# Patient Record
Sex: Male | Born: 1955 | Race: Black or African American | Hispanic: No | Marital: Married | Smoking: Former smoker
Health system: Southern US, Community
[De-identification: ages and names within clinical notes are randomized; demographics above are authoritative.]

## PROBLEM LIST (undated history)

## (undated) DIAGNOSIS — T7840XA Allergy, unspecified, initial encounter: Secondary | ICD-10-CM

## (undated) DIAGNOSIS — M1990 Unspecified osteoarthritis, unspecified site: Secondary | ICD-10-CM

## (undated) DIAGNOSIS — I10 Essential (primary) hypertension: Secondary | ICD-10-CM

## (undated) DIAGNOSIS — K635 Polyp of colon: Secondary | ICD-10-CM

## (undated) DIAGNOSIS — E559 Vitamin D deficiency, unspecified: Secondary | ICD-10-CM

## (undated) DIAGNOSIS — N529 Male erectile dysfunction, unspecified: Secondary | ICD-10-CM

## (undated) DIAGNOSIS — E785 Hyperlipidemia, unspecified: Secondary | ICD-10-CM

## (undated) HISTORY — PX: HERNIA REPAIR: SHX51

## (undated) HISTORY — DX: Hyperlipidemia, unspecified: E78.5

## (undated) HISTORY — DX: Polyp of colon: K63.5

## (undated) HISTORY — DX: Essential (primary) hypertension: I10

## (undated) HISTORY — DX: Unspecified osteoarthritis, unspecified site: M19.90

## (undated) HISTORY — DX: Vitamin D deficiency, unspecified: E55.9

## (undated) HISTORY — PX: CYSTECTOMY: SUR359

## (undated) HISTORY — DX: Allergy, unspecified, initial encounter: T78.40XA

## (undated) HISTORY — DX: Male erectile dysfunction, unspecified: N52.9

---

## 2006-11-17 DIAGNOSIS — K635 Polyp of colon: Secondary | ICD-10-CM

## 2006-11-17 HISTORY — DX: Polyp of colon: K63.5

## 2011-11-28 ENCOUNTER — Ambulatory Visit (HOSPITAL_COMMUNITY)
Admission: RE | Admit: 2011-11-28 | Discharge: 2011-11-28 | Disposition: A | Discharge status: Home / Self Care | Payer: BC Managed Care – PPO | Source: Ambulatory Visit | Attending: Family Medicine | Admitting: Family Medicine

## 2011-11-28 ENCOUNTER — Other Ambulatory Visit: Payer: Self-pay | Admitting: Family Medicine

## 2011-11-28 DIAGNOSIS — M79651 Pain in right thigh: Secondary | ICD-10-CM

## 2011-11-28 DIAGNOSIS — M79609 Pain in unspecified limb: Secondary | ICD-10-CM | POA: Insufficient documentation

## 2013-07-30 ENCOUNTER — Other Ambulatory Visit: Payer: Self-pay | Admitting: Family Medicine

## 2013-08-01 NOTE — Telephone Encounter (Signed)
Patient needs to be seen. Has exceeded time since last visit. Needs to bring all medications to next appointment. Due for OV. Rx refilled x 1

## 2013-08-01 NOTE — Telephone Encounter (Signed)
Last seen 01/28/13  [facility]

## 2013-08-01 NOTE — Telephone Encounter (Signed)
[facility] notified of refill  

## 2013-08-03 ENCOUNTER — Other Ambulatory Visit: Payer: Self-pay | Admitting: Nurse Practitioner

## 2013-08-04 NOTE — Telephone Encounter (Signed)
Patient needs to be seen. Has exceeded time since last visit. Needs to bring all medications to next appointment.   

## 2013-08-04 NOTE — Telephone Encounter (Signed)
Not seen since [facility]  You just filled Norvasc x 1 and said needs to be seen

## 2013-08-19 ENCOUNTER — Ambulatory Visit (INDEPENDENT_AMBULATORY_CARE_PROVIDER_SITE_OTHER): Payer: BC Managed Care – PPO | Admitting: Family Medicine

## 2013-08-19 ENCOUNTER — Encounter: Payer: Self-pay | Admitting: Family Medicine

## 2013-08-19 ENCOUNTER — Ambulatory Visit (INDEPENDENT_AMBULATORY_CARE_PROVIDER_SITE_OTHER): Payer: BC Managed Care – PPO

## 2013-08-19 VITALS — BP 144/81 | HR 64 | Temp 97.0°F | Ht 71.5 in | Wt 250.6 lb

## 2013-08-19 DIAGNOSIS — M1990 Unspecified osteoarthritis, unspecified site: Secondary | ICD-10-CM | POA: Insufficient documentation

## 2013-08-19 DIAGNOSIS — E559 Vitamin D deficiency, unspecified: Secondary | ICD-10-CM | POA: Insufficient documentation

## 2013-08-19 DIAGNOSIS — E669 Obesity, unspecified: Secondary | ICD-10-CM

## 2013-08-19 DIAGNOSIS — M25512 Pain in left shoulder: Secondary | ICD-10-CM

## 2013-08-19 DIAGNOSIS — E785 Hyperlipidemia, unspecified: Secondary | ICD-10-CM

## 2013-08-19 DIAGNOSIS — T7840XA Allergy, unspecified, initial encounter: Secondary | ICD-10-CM | POA: Insufficient documentation

## 2013-08-19 DIAGNOSIS — K635 Polyp of colon: Secondary | ICD-10-CM | POA: Insufficient documentation

## 2013-08-19 DIAGNOSIS — J302 Other seasonal allergic rhinitis: Secondary | ICD-10-CM

## 2013-08-19 DIAGNOSIS — I10 Essential (primary) hypertension: Secondary | ICD-10-CM | POA: Insufficient documentation

## 2013-08-19 DIAGNOSIS — J309 Allergic rhinitis, unspecified: Secondary | ICD-10-CM

## 2013-08-19 DIAGNOSIS — M25519 Pain in unspecified shoulder: Secondary | ICD-10-CM

## 2013-08-19 DIAGNOSIS — N529 Male erectile dysfunction, unspecified: Secondary | ICD-10-CM | POA: Insufficient documentation

## 2013-08-19 MED ORDER — NEBIVOLOL HCL 20 MG PO TABS: 20.0000 mg | ORAL_TABLET | Freq: Every morning | ORAL | Status: DC

## 2013-08-19 MED ORDER — INDOMETHACIN 25 MG PO CAPS: 25.0000 mg | ORAL_CAPSULE | Freq: Two times a day (BID) | ORAL | Status: DC

## 2013-08-19 MED ORDER — METHOCARBAMOL 750 MG PO TABS: 750.0000 mg | ORAL_TABLET | ORAL | Status: DC | PRN

## 2013-08-19 MED ORDER — AMLODIPINE BESYLATE 10 MG PO TABS: ORAL_TABLET | ORAL | Status: DC

## 2013-08-19 MED ORDER — LEVOCETIRIZINE DIHYDROCHLORIDE 5 MG PO TABS: ORAL_TABLET | ORAL | Status: DC

## 2013-08-19 NOTE — Patient Instructions (Addendum)
      Dr Manan Olmo's Recommendations  For nutrition information, I recommend books:  1).Eat to Live by Dr Joel Fuhrman. 2).Prevent and Reverse Heart Disease by Dr Caldwell Esselstyn. 3) Dr Neal Barnard's Book:  Program to Reverse Diabetes 4) The [place] Study by T Colin Campbell  Exercise recommendations are:  If unable to walk, then the patient can exercise in a chair 3 times a day. By flapping arms like a bird gently and raising legs outwards to the front.  If ambulatory, the patient can go for walks for 30 minutes 3 times a week. Then increase the intensity and duration as tolerated.  Goal is to try to attain exercise frequency to 5 times a week.  If applicable: Best to perform resistance exercises (machines or weights) 2 days a week and cardio type exercises 3 days per week.  

## 2013-08-19 NOTE — Progress Notes (Signed)
Patient ID: Gregory Evans, male   DOB: 27-Mar-1956, 57 y.o.   MRN: [id number] SUBJECTIVE: CC: Chief Complaint  Patient presents with  . Follow-up    follow up bp  left arm painful shoulder rotator cuff area  states i"its doing same thing as the right shoulder . thinks over worked   . 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 Saw orthopedics and received a cortisone shot.and  Exercise. Now the left shoulder is giving the same pro blem. Was using the left shoulder more.  Patient is here for follow up of hypertension: denies Headache;deniesChest Pain;denies weakness;denies Shortness of Breath or Orthopnea;denies Visual changes;denies palpitations;denies cough;denies pedal edema;denies symptoms of TIA or stroke; admits to Compliance with medications. denies Problems with medications.  Past Medical History  Diagnosis Date  . Hypertension   . Colon polyp 2008  . Allergy   . Vitamin D deficiency   . Osteoarthritis   . Erectile dysfunction   . Hyperlipidemia    No past surgical history on file. History   Social History  . Marital Status: Married    Spouse Name: N/A    Number of Children: N/A  . Years of Education: N/A   Occupational History  .  [employer] [place]   Social History Main Topics  . Smoking status: Former Smoker    Quit date: 08/19/1993  . Smokeless tobacco: Not on file  . Alcohol Use: Not on file  . Drug Use: Not on file  . Sexual Activity: Not on file   Other Topics Concern  . Not on file   Social History Narrative   Married    6 children   Outside pet   No family history on file. Current Outpatient Prescriptions on File Prior to Visit  Medication Sig Dispense Refill  . amLODipine (NORVASC) 10 MG tablet TAKE 1 TABLET BY MOUTH EVERY DAY  30 tablet  0  . levocetirizine (XYZAL) 5 MG tablet TAKE 1 TABLET BY MOUTH EVERY DAY  30 tablet  0   No current facility-administered medications on file prior to visit.    Allergies  Allergen Reactions  . Ace Inhibitors     angioedema  . Cozaar [Losartan Potassium]    Immunization History  Administered Date(s) Administered  . Tdap 09/17/2006   Prior to Admission medications   Medication Sig Start Date End Date Taking? Authorizing Provider  amLODipine (NORVASC) 10 MG tablet TAKE 1 TABLET BY MOUTH EVERY DAY 07/30/13  Yes Ileana Ladd, MD  BYSTOLIC 20 MG TABS  08/03/13  Yes Historical Provider, MD  levocetirizine (XYZAL) 5 MG tablet TAKE 1 TABLET BY MOUTH EVERY DAY 08/03/13  Yes Ileana Ladd, MD  methocarbamol (ROBAXIN) 750 MG tablet Take 750 mg by mouth as needed.  06/27/13  Yes Historical Provider, MD    ROS: As above in the HPI. All other systems are stable or negative.  OBJECTIVE: APPEARANCE:  Patient in no acute distress.The patient appeared well nourished and normally developed. Acyanotic. Waist: VITAL SIGNS:BP 144/81  Pulse 64  Temp(Src) 97 F (36.1 C) (Oral)  Ht 5' 11.5" (1.816 m)  Wt 250 lb 9.6 oz (113.671 kg)  BMI 34.47 kg/m2 130/80 [place] Male  SKIN: warm and  Dry without overt rashes, tattoos and scars  HEAD and Neck: without JVD, Head and scalp: normal Eyes:No scleral icterus. Fundi normal, eye movements normal. Ears: Auricle normal, canal normal, Tympanic membranes normal, insufflation normal. Nose: normal  Throat: normal Neck & thyroid: normal  CHEST & LUNGS: Chest wall: normal Lungs: Clear  CVS: Reveals the PMI to be normally located. Regular rhythm, First and Second Heart sounds are normal,  absence of murmurs, rubs or gallops. Peripheral vasculature: Radial pulses: normal Dorsal pedis pulses: normal Posterior pulses: normal  ABDOMEN:  Appearance: obese Benign, no organomegaly, no masses, no Abdominal Aortic enlargement. No Guarding , no rebound. No Bruits. Bowel sounds: normal  RECTAL: N/A GU: N/A  EXTREMETIES: nonedematous.  MUSCULOSKELETAL:  Spine: normal Joints: left shoulder  FROM  NEUROLOGIC: oriented to time,place and person; nonfocal.  ASSESSMENT:  PLAN: Orders Placed This Encounter  Procedures  . DG Shoulder Left    Standing Status: Future     Number of Occurrences: 1     Standing Expiration Date: 10/19/2014    Order Specific Question:  Reason for Exam (SYMPTOM  OR DIAGNOSIS REQUIRED)    Answer:  left shoulder pain    Order Specific Question:  Preferred imaging location?    Answer:  Internal  . CMP14+EGFR  . NMR, lipoprofile  . Vit D  25 hydroxy (rtn osteoporosis monitoring)    [facility] reading (PRIMARY) by  Dr. Modesto Charon: No acute findings.                         Meds ordered this encounter  Medications  . DISCONTD: methocarbamol (ROBAXIN) 750 MG tablet    Sig: Take 750 mg by mouth as needed.   Marland Kitchen DISCONTD: BYSTOLIC 20 MG TABS    Sig:   . Nebivolol HCl (BYSTOLIC) 20 MG TABS    Sig: Take 1 tablet (20 mg total) by mouth every morning.    Dispense:  30 tablet    Refill:  5  . amLODipine (NORVASC) 10 MG tablet    Sig: TAKE 1 TABLET BY MOUTH EVERY DAY    Dispense:  30 tablet    Refill:  5    Now due for an Office visit.  . methocarbamol (ROBAXIN) 750 MG tablet    Sig: Take 1 tablet (750 mg total) by mouth as needed.    Dispense:  30 tablet    Refill:  1  . levocetirizine (XYZAL) 5 MG tablet    Sig: TAKE 1 TABLET BY MOUTH EVERY DAY    Dispense:  30 tablet    Refill:  3    Needs to be seen. Last refill.  . indomethacin (INDOCIN) 25 MG capsule    Sig: Take 1 capsule (25 mg total) by mouth 2 (two) times daily with a meal.    Dispense:  30 capsule    Refill:  0    Rx sildenafil 20 mg # 90 RF x 3 Take 2 to 5 prn sexual activity.  Procedure: Dx: left shoulder tendinopathy Medication: lidocaine+ Marcaine+1/2 cc K40 as per protocol. Area of the anterior left shoulder was prepped with Betadine and alcohol. Anterior approach injection under asepsis into the left shoulder joint. No anesthesia was necessary. Medication administered intra-articularly  without any problems or  Resistance. bandaid applied. Patient started to feel relief within 5 minutes of administration.  Return in about 4 months (around 12/20/2013) for Recheck medical problems.  Criag Wicklund P. Modesto Charon, M.D.

## 2013-08-20 LAB — CMP14+EGFR
ALT: 22 IU/L (ref 0–44)
AST: 18 IU/L (ref 0–40)
Albumin/Globulin Ratio: 1.8 (ref 1.1–2.5)
Albumin: 4.6 g/dL (ref 3.5–5.5)
Alkaline Phosphatase: 40 IU/L (ref 39–117)
BUN/Creatinine Ratio: 16 (ref 9–20)
BUN: 14 mg/dL (ref 6–24)
CO2: 26 mmol/L (ref 18–29)
Calcium: 9.2 mg/dL (ref 8.7–10.2)
Chloride: 100 mmol/L (ref 97–108)
Creatinine, Ser: 0.87 mg/dL (ref 0.76–1.27)
GFR calc Af Amer: 109 mL/min/{1.73_m2} (ref >59)
GFR calc non Af Amer: 94 mL/min/{1.73_m2} (ref >59)
Globulin, Total: 2.5 g/dL (ref 1.5–4.5)
Glucose: 99 mg/dL (ref 65–99)
Potassium: 4.5 mmol/L (ref 3.5–5.2)
Sodium: 141 mmol/L (ref 134–144)
Total Bilirubin: 0.5 mg/dL (ref 0.0–1.2)
Total Protein: 7.1 g/dL (ref 6.0–8.5)

## 2013-08-20 LAB — NMR, LIPOPROFILE
Cholesterol: 171 mg/dL (ref <200)
HDL Cholesterol by NMR: 49 mg/dL (ref >=40)
HDL Particle Number: 39.5 umol/L (ref >=30.5)
LDL Particle Number: 1669 nmol/L — ABNORMAL HIGH (ref <1000)
LDL Size: 20.7 nm (ref >20.5)
LDLC SERPL CALC-MCNC: 106 mg/dL — ABNORMAL HIGH (ref <100)
LP-IR Score: 75 — ABNORMAL HIGH (ref <=45)
Small LDL Particle Number: 816 nmol/L — ABNORMAL HIGH (ref <=527)
Triglycerides by NMR: 82 mg/dL (ref <150)

## 2013-08-20 LAB — VITAMIN D 25 HYDROXY (VIT D DEFICIENCY, FRACTURES): Vit D, 25-Hydroxy: 24.2 ng/mL — ABNORMAL LOW (ref 30.0–100.0)

## 2013-08-21 ENCOUNTER — Other Ambulatory Visit: Payer: Self-pay | Admitting: Family Medicine

## 2013-08-21 DIAGNOSIS — E559 Vitamin D deficiency, unspecified: Secondary | ICD-10-CM

## 2013-08-21 DIAGNOSIS — E785 Hyperlipidemia, unspecified: Secondary | ICD-10-CM

## 2013-08-21 MED ORDER — PRAVASTATIN SODIUM 20 MG PO TABS: 20.0000 mg | ORAL_TABLET | Freq: Every day | ORAL | Status: DC

## 2013-09-05 ENCOUNTER — Other Ambulatory Visit: Payer: Self-pay | Admitting: Family Medicine

## 2013-10-11 ENCOUNTER — Other Ambulatory Visit: Payer: Self-pay | Admitting: Family Medicine

## 2013-10-18 ENCOUNTER — Other Ambulatory Visit: Payer: Self-pay | Admitting: Family Medicine

## 2013-12-03 ENCOUNTER — Other Ambulatory Visit: Payer: Self-pay | Admitting: Family Medicine

## 2013-12-05 NOTE — Telephone Encounter (Signed)
Call patient : Prescription refilled & sent to pharmacy in [place]. 

## 2013-12-14 ENCOUNTER — Other Ambulatory Visit: Payer: Self-pay | Admitting: Family Medicine

## 2013-12-22 ENCOUNTER — Ambulatory Visit: Payer: BC Managed Care – PPO | Admitting: Family Medicine

## 2014-02-11 ENCOUNTER — Other Ambulatory Visit: Payer: Self-pay | Admitting: Family Medicine

## 2014-03-09 ENCOUNTER — Other Ambulatory Visit: Payer: Self-pay | Admitting: Family Medicine

## 2014-03-09 NOTE — Telephone Encounter (Signed)
Last seen 08/19/13  [facility]

## 2014-03-10 NOTE — Telephone Encounter (Signed)
Patient needs to be seen. Has exceeded time since last visit. Limited quantity refilled. Needs to bring all medications to next appointment.   

## 2014-03-17 ENCOUNTER — Other Ambulatory Visit: Payer: Self-pay | Admitting: Family Medicine

## 2014-04-21 ENCOUNTER — Telehealth: Payer: Self-pay | Admitting: Family Medicine

## 2014-04-21 MED ORDER — NEBIVOLOL HCL 10 MG PO TABS: 20.0000 mg | ORAL_TABLET | Freq: Every day | ORAL | Status: DC

## 2014-04-21 MED ORDER — AMLODIPINE BESYLATE 10 MG PO TABS
10.0000 mg | ORAL_TABLET | Freq: Every day | ORAL | Status: DC
Start: 2014-04-21 — End: 2014-05-10

## 2014-04-21 NOTE — Telephone Encounter (Signed)
Appt scheduled for 05/10/14. Patient given one refill of amlodipine and bystolic.  Aware that he will have to be seen before he can get any more refills.

## 2014-05-10 ENCOUNTER — Encounter: Payer: Self-pay | Admitting: Family

## 2014-05-10 ENCOUNTER — Ambulatory Visit (INDEPENDENT_AMBULATORY_CARE_PROVIDER_SITE_OTHER): Payer: BC Managed Care – PPO | Admitting: Family

## 2014-05-10 ENCOUNTER — Encounter (INDEPENDENT_AMBULATORY_CARE_PROVIDER_SITE_OTHER): Payer: Self-pay

## 2014-05-10 VITALS — BP 138/82 | HR 47 | Temp 98.9°F | Ht 71.5 in | Wt 264.0 lb

## 2014-05-10 DIAGNOSIS — Z23 Encounter for immunization: Secondary | ICD-10-CM

## 2014-05-10 DIAGNOSIS — E785 Hyperlipidemia, unspecified: Secondary | ICD-10-CM

## 2014-05-10 DIAGNOSIS — E559 Vitamin D deficiency, unspecified: Secondary | ICD-10-CM

## 2014-05-10 DIAGNOSIS — M109 Gout, unspecified: Secondary | ICD-10-CM

## 2014-05-10 DIAGNOSIS — Z0000 Encounter for general adult medical examination without abnormal findings: Secondary | ICD-10-CM

## 2014-05-10 DIAGNOSIS — N529 Male erectile dysfunction, unspecified: Secondary | ICD-10-CM

## 2014-05-10 DIAGNOSIS — I10 Essential (primary) hypertension: Secondary | ICD-10-CM

## 2014-05-10 MED ORDER — SILDENAFIL CITRATE 20 MG PO TABS: 20.0000 mg | ORAL_TABLET | Freq: Three times a day (TID) | ORAL | Status: DC

## 2014-05-10 MED ORDER — PRAVASTATIN SODIUM 20 MG PO TABS: ORAL_TABLET | ORAL | Status: DC

## 2014-05-10 MED ORDER — METOPROLOL TARTRATE 100 MG PO TABS: 100.0000 mg | ORAL_TABLET | Freq: Two times a day (BID) | ORAL | Status: DC

## 2014-05-10 MED ORDER — AMLODIPINE BESYLATE 10 MG PO TABS: 10.0000 mg | ORAL_TABLET | Freq: Every day | ORAL | Status: DC

## 2014-05-10 MED ORDER — INDOMETHACIN 25 MG PO CAPS: ORAL_CAPSULE | ORAL | Status: DC

## 2014-05-10 NOTE — Patient Instructions (Addendum)
Herpes Zoster Virus Vaccine What is this medicine? HERPES ZOSTER VIRUS VACCINE (HUR peez ZOS ter vahy ruhs vak SEEN) is a vaccine. It is used to prevent shingles in adults 60 years old and over. This vaccine is not used to treat shingles or nerve pain from shingles. This medicine may be used for other purposes; ask your health care provider or pharmacist if you have questions. COMMON BRAND NAME(S): Varivax, Zostavax What should I tell my health care provider before I take this medicine? They need to know if you have any of these conditions: -cancer like leukemia or lymphoma -immune system problems or therapy -infection with fever -tuberculosis -an unusual or allergic reaction to vaccines, neomycin, gelatin, other medicines, foods, dyes, or preservatives -pregnant or trying to get pregnant -breast-feeding How should I use this medicine? This vaccine is for injection under the skin. It is given by a health care professional. Talk to your pediatrician regarding the use of this medicine in children. This medicine is not approved for use in children. Overdosage: If you think you have taken too much of this medicine contact a poison control center or emergency room at once. NOTE: This medicine is only for you. Do not share this medicine with others. What if I miss a dose? This does not apply. What may interact with this medicine? Do not take this medicine with any of the following medications: -adalimumab -anakinra -etanercept -infliximab -medicines to treat cancer -medicines that suppress your immune system This medicine may also interact with the following medications: -immunoglobulins -steroid medicines like prednisone or cortisone This list may not describe all possible interactions. Give your health care provider a list of all the medicines, herbs, non-prescription drugs, or dietary supplements you use. Also tell them if you smoke, drink alcohol, or use illegal drugs. Some items may  interact with your medicine. What should I watch for while using this medicine? Visit your doctor for regular check ups. This vaccine, like all vaccines, may not fully protect everyone. After receiving this vaccine it may be possible to pass chickenpox infection to others. Avoid people with immune system problems, pregnant women who have not had chickenpox, and newborns of women who have not had chickenpox. Talk to your doctor for more information. What side effects may I notice from receiving this medicine? Side effects that you should report to your doctor or health care professional as soon as possible: -allergic reactions like skin rash, itching or hives, swelling of the face, lips, or tongue -breathing problems -feeling faint or lightheaded, falls -fever, flu-like symptoms -pain, tingling, numbness in the hands or feet -swelling of the ankles, feet, hands -unusually weak or tired Side effects that usually do not require medical attention (report to your doctor or health care professional if they continue or are bothersome): -aches or pains -chickenpox-like rash -diarrhea -headache -loss of appetite -nausea, vomiting -redness, pain, swelling at site where injected -runny nose This list may not describe all possible side effects. Call your doctor for medical advice about side effects. You may report side effects to FDA at 1-800-FDA-1088. Where should I keep my medicine? This drug is given in a hospital or clinic and will not be stored at home. NOTE: This sheet is a summary. It may not cover all possible information. If you have questions about this medicine, talk to your doctor, pharmacist, or health care provider.  2015, Elsevier/Gold Standard. (2010-04-22 17:43:50)  Hypertension Hypertension, commonly called high blood pressure, is when the force of blood pumping through your  arteries is too strong. Your arteries are the blood vessels that carry blood from your heart throughout  your body. A blood pressure reading consists of a higher number over a lower number, such as 110/72. The higher number (systolic) is the pressure inside your arteries when your heart pumps. The lower number (diastolic) is the pressure inside your arteries when your heart relaxes. Ideally you want your blood pressure below 120/80. Hypertension forces your heart to work harder to pump blood. Your arteries may become narrow or stiff. Having hypertension puts you at risk for heart disease, stroke, and other problems.  RISK FACTORS Some risk factors for high blood pressure are controllable. Others are not.  Risk factors you cannot control include:   Race. You may be at higher risk if you are African American.  Age. Risk increases with age.  Gender. Men are at higher risk than women before age 45 years. After age 10, women are at higher risk than men. Risk factors you can control include:  Not getting enough exercise or physical activity.  Being overweight.  Getting too much fat, sugar, calories, or salt in your diet.  Drinking too much alcohol. SIGNS AND SYMPTOMS Hypertension does not usually cause signs or symptoms. Extremely high blood pressure (hypertensive crisis) may cause headache, anxiety, shortness of breath, and nosebleed. DIAGNOSIS  To check if you have hypertension, your health care provider will measure your blood pressure while you are seated, with your arm held at the level of your heart. It should be measured at least twice using the same arm. Certain conditions can cause a difference in blood pressure between your right and left arms. A blood pressure reading that is higher than normal on one occasion does not mean that you need treatment. If one blood pressure reading is high, ask your health care provider about having it checked again. TREATMENT  Treating high blood pressure includes making lifestyle changes and possibly taking medication. Living a healthy lifestyle can help  lower high blood pressure. You may need to change some of your habits. Lifestyle changes may include:  Following the DASH diet. This diet is high in fruits, vegetables, and whole grains. It is low in salt, red meat, and added sugars.  Getting at least 2 1/2 hours of brisk physical activity every week.  Losing weight if necessary.  Not smoking.  Limiting alcoholic beverages.  Learning ways to reduce stress. If lifestyle changes are not enough to get your blood pressure under control, your health care provider may prescribe medicine. You may need to take more than one. Work closely with your health care provider to understand the risks and benefits. HOME CARE INSTRUCTIONS  Have your blood pressure rechecked as directed by your health care provider.   Only take medicine as directed by your health care provider. Follow the directions carefully. Blood pressure medicines must be taken as prescribed. The medicine does not work as well when you skip doses. Skipping doses also puts you at risk for problems.   Do not smoke.   Monitor your blood pressure at home as directed by your health care provider. SEEK MEDICAL CARE IF:   You think you are having a reaction to medicines taken.  You have recurrent headaches or feel dizzy.  You have swelling in your ankles.  You have trouble with your vision. SEEK IMMEDIATE MEDICAL CARE IF:  You develop a severe headache or confusion.  You have unusual weakness, numbness, or feel faint.  You have severe chest  or abdominal pain.  You vomit repeatedly.  You have trouble breathing. MAKE SURE YOU:   Understand these instructions.  Will watch your condition.  Will get help right away if you are not doing well or get worse. Document Released: 11/03/2005 Document Revised: 11/08/2013 Document Reviewed: 08/26/2013 [facility] Patient Information 2015 [place], [place]. This information is not intended to replace advice given to you by your health  care provider. Make sure you discuss any questions you have with your health care provider.

## 2014-05-10 NOTE — Progress Notes (Signed)
Subjective:    Patient ID: Gregory Evans, male    DOB: 09-18-1956, 58 y.o.   MRN: [id number]  Hypertension This is a chronic problem. The current episode started more than 1 year ago. The problem has been waxing and waning since onset. The problem is uncontrolled. Associated symptoms include peripheral edema. Pertinent negatives include no anxiety, headaches, palpitations or shortness of breath. Risk factors for coronary artery disease include dyslipidemia, male gender and obesity. Past treatments include beta blockers and calcium channel blockers. The current treatment provides moderate improvement. There is no history of kidney disease, CAD/MI or a thyroid problem. There is no history of sleep apnea.  Hyperlipidemia This is a chronic problem. The current episode started more than 1 year ago. The problem is controlled. Recent lipid tests were reviewed and are normal. He has no history of diabetes or hypothyroidism. Associated symptoms include leg pain. Pertinent negatives include no shortness of breath. Current antihyperlipidemic treatment includes statins. The current treatment provides moderate improvement of lipids. Risk factors for coronary artery disease include dyslipidemia, hypertension and male sex.      Review of Systems  HENT: Negative.   Respiratory: Negative.  Negative for shortness of breath.   Cardiovascular: Negative for palpitations.  Gastrointestinal: Negative.   Genitourinary: Negative.   Musculoskeletal: Negative.   Neurological: Negative for headaches.  All other systems reviewed and are negative.      Objective:   Physical Exam  Vitals reviewed. Constitutional: He is oriented to person, place, and time. He appears well-developed and well-nourished. No distress.  HENT:  Head: Normocephalic.  Right Ear: External ear normal.  Left Ear: External ear normal.  Mouth/Throat: Oropharynx is clear and moist.  Eyes: Pupils are equal, round, and reactive to light. Right eye  exhibits no discharge. Left eye exhibits no discharge.  Neck: Normal range of motion. Neck supple. No thyromegaly present.  Cardiovascular: Normal rate, regular rhythm, normal heart sounds and intact distal pulses.   No murmur heard. Pulmonary/Chest: Effort normal and breath sounds normal. No respiratory distress. He has no wheezes.  Abdominal: Soft. Bowel sounds are normal. He exhibits no distension. There is no tenderness.  Musculoskeletal: Normal range of motion. He exhibits no edema and no tenderness.  Neurological: He is alert and oriented to person, place, and time. He has normal reflexes. No cranial nerve deficit.  Skin: Skin is warm and dry. No rash noted. No erythema.  Psychiatric: He has a normal mood and affect. His behavior is normal. Judgment and thought content normal.   BP 148/81  Pulse 47  Temp(Src) 98.9 F (37.2 C) (Oral)  Ht 5' 11.5" (1.816 m)  Wt 264 lb (119.75 kg)  BMI 36.31 kg/m2        Assessment & Plan:  1. Need for shingles vaccine - Varicella-zoster vaccine subcutaneous  2. Hyperlipidemia - Lipid panel - pravastatin (PRAVACHOL) 20 MG tablet; TAKE 1 TABLET (20 MG TOTAL) BY MOUTH DAILY.  Dispense: 30 tablet; Refill: 6  3. Vitamin D deficiency - Vit D  25 hydroxy (rtn osteoporosis monitoring)  4. Erectile dysfunction, unspecified erectile dysfunction type - sildenafil (REVATIO) 20 MG tablet; Take 1 tablet (20 mg total) by mouth 3 (three) times daily.  Dispense: 30 tablet; Refill: 6  5. Essential hypertension -Pt started on metoprolol 129m BID today and d/c  bystolic due to price per month - CMP14+EGFR - amLODipine (NORVASC) 10 MG tablet; Take 1 tablet (10 mg total) by mouth daily.  Dispense: 30 tablet; Refill: 11 - metoprolol (  LOPRESSOR) 100 MG tablet; Take 1 tablet (100 mg total) by mouth 2 (two) times daily.  Dispense: 60 tablet; Refill: 6  6. Annual physical exam  - PSA, total and free  7. Gout of ankle, unspecified cause, unspecified  chronicity, unspecified laterality - indomethacin (INDOCIN) 25 MG capsule; TAKE 1 CAPSULE (25 MG TOTAL) BY MOUTH 2 (TWO) TIMES DAILY WITH A MEAL.  Dispense: 60 capsule; Refill: 11   Continue all meds Labs pending Health Maintenance reviewed Diet and exercise encouraged RTO 6 months  Evelina Dun, FNP

## 2014-05-11 LAB — LIPID PANEL
Chol/HDL Ratio: 3.2 ratio units (ref 0.0–5.0)
Cholesterol, Total: 165 mg/dL (ref 100–199)
HDL: 51 mg/dL (ref >39)
LDL Calculated: 93 mg/dL (ref 0–99)
Triglycerides: 105 mg/dL (ref 0–149)
VLDL Cholesterol Cal: 21 mg/dL (ref 5–40)

## 2014-05-11 LAB — CMP14+EGFR
ALT: 22 IU/L (ref 0–44)
AST: 18 IU/L (ref 0–40)
Albumin/Globulin Ratio: 1.9 (ref 1.1–2.5)
Albumin: 4.5 g/dL (ref 3.6–4.8)
Alkaline Phosphatase: 36 IU/L — ABNORMAL LOW (ref 39–117)
BUN/Creatinine Ratio: 21 (ref 10–22)
BUN: 20 mg/dL (ref 8–27)
CO2: 23 mmol/L (ref 18–29)
Calcium: 9.5 mg/dL (ref 8.6–10.2)
Chloride: 102 mmol/L (ref 97–108)
Creatinine, Ser: 0.96 mg/dL (ref 0.76–1.27)
GFR calc Af Amer: 99 mL/min/{1.73_m2} (ref >59)
GFR calc non Af Amer: 86 mL/min/{1.73_m2} (ref >59)
Globulin, Total: 2.4 g/dL (ref 1.5–4.5)
Glucose: 102 mg/dL — ABNORMAL HIGH (ref 65–99)
Potassium: 4.2 mmol/L (ref 3.5–5.2)
Sodium: 142 mmol/L (ref 134–144)
Total Bilirubin: 0.4 mg/dL (ref 0.0–1.2)
Total Protein: 6.9 g/dL (ref 6.0–8.5)

## 2014-05-11 LAB — VITAMIN D 25 HYDROXY (VIT D DEFICIENCY, FRACTURES): Vit D, 25-Hydroxy: 21.1 ng/mL — ABNORMAL LOW (ref 30.0–100.0)

## 2014-05-11 LAB — PSA, TOTAL AND FREE
PSA, Free Pct: 25.2 %
PSA, Free: 0.53 ng/mL
PSA: 2.1 ng/mL (ref 0.0–4.0)

## 2014-05-20 ENCOUNTER — Other Ambulatory Visit: Payer: Self-pay | Admitting: Family

## 2014-07-10 ENCOUNTER — Ambulatory Visit (INDEPENDENT_AMBULATORY_CARE_PROVIDER_SITE_OTHER): Payer: BC Managed Care – PPO | Admitting: Family Medicine

## 2014-07-10 VITALS — BP 141/67 | HR 45 | Temp 97.9°F | Ht 71.0 in | Wt 265.0 lb

## 2014-07-10 DIAGNOSIS — G5700 Lesion of sciatic nerve, unspecified lower limb: Secondary | ICD-10-CM

## 2014-07-10 DIAGNOSIS — G5701 Lesion of sciatic nerve, right lower limb: Secondary | ICD-10-CM

## 2014-07-10 MED ORDER — IBUPROFEN 800 MG PO TABS: 800.0000 mg | ORAL_TABLET | Freq: Three times a day (TID) | ORAL | Status: DC | PRN

## 2014-07-10 MED ORDER — HYDROCODONE-ACETAMINOPHEN 5-325 MG PO TABS: 1.0000 | ORAL_TABLET | Freq: Four times a day (QID) | ORAL | Status: DC | PRN

## 2014-07-10 NOTE — Progress Notes (Signed)
   Subjective:    Patient ID: Fredrico Beedle, male    DOB: July 01, 1956, 58 y.o.   MRN: [id number]  HPI  This 58 y.o. male presents for evaluation of right groin pain.  Review of Systems    No chest pain, SOB, HA, dizziness, vision change, N/V, diarrhea, constipation, dysuria, urinary urgency or frequency, myalgias, arthralgias or rash.  Objective:   Physical Exam Vital signs noted  Well developed well nourished male.  HEENT - Head atraumatic Normocephalic Respiratory - Lungs CTA bilateral Cardiac - RRR S1 and S2 without murmur GI - Abdomen soft Nontender and bowel sounds active x 4 Neuro - Grossly intact. MS - Tenderness right groin and buttocks with palpation and with internal and external rotation of right hip.  Negative right SLR.      Assessment & Plan:  Piriformis syndrome of right side - Plan: HYDROcodone-acetaminophen (NORCO) 5-325 MG per tablet, ibuprofen (ADVIL,MOTRIN) 800 MG tablet po tid x 7-10 days Note to return to work on 07/12/14.  Follow up prn if not better.  Lysbeth Penner FNP

## 2014-07-14 ENCOUNTER — Encounter: Payer: Self-pay | Admitting: *Deleted

## 2014-07-14 ENCOUNTER — Telehealth: Payer: Self-pay | Admitting: Family Medicine

## 2014-07-14 NOTE — Telephone Encounter (Signed)
Ok to have note and can come and pick up

## 2014-07-14 NOTE — Telephone Encounter (Signed)
Patient aware note will be up front to be picked up

## 2014-09-05 ENCOUNTER — Encounter (INDEPENDENT_AMBULATORY_CARE_PROVIDER_SITE_OTHER): Payer: Self-pay

## 2014-09-05 ENCOUNTER — Encounter: Payer: Self-pay | Admitting: Family Medicine

## 2014-09-05 ENCOUNTER — Ambulatory Visit (INDEPENDENT_AMBULATORY_CARE_PROVIDER_SITE_OTHER): Payer: BC Managed Care – PPO

## 2014-09-05 ENCOUNTER — Ambulatory Visit (INDEPENDENT_AMBULATORY_CARE_PROVIDER_SITE_OTHER): Payer: BC Managed Care – PPO | Admitting: Family Medicine

## 2014-09-05 VITALS — BP 143/71 | HR 68 | Temp 98.3°F | Ht 71.5 in | Wt 267.0 lb

## 2014-09-05 DIAGNOSIS — M25572 Pain in left ankle and joints of left foot: Secondary | ICD-10-CM

## 2014-09-05 DIAGNOSIS — M1000 Idiopathic gout, unspecified site: Secondary | ICD-10-CM

## 2014-09-05 DIAGNOSIS — G5701 Lesion of sciatic nerve, right lower limb: Secondary | ICD-10-CM

## 2014-09-05 MED ORDER — HYDROCODONE-ACETAMINOPHEN 5-325 MG PO TABS: 1.0000 | ORAL_TABLET | Freq: Four times a day (QID) | ORAL | Status: DC | PRN

## 2014-09-05 MED ORDER — KETOROLAC TROMETHAMINE 30 MG/ML IJ SOLN
30.0000 mg | Freq: Once | INTRAMUSCULAR | Status: AC
  Administered 2014-09-05: 30 mg via INTRAMUSCULAR

## 2014-09-05 MED ORDER — METHYLPREDNISOLONE (PAK) 4 MG PO TABS: ORAL_TABLET | ORAL | Status: DC

## 2014-09-05 NOTE — Progress Notes (Signed)
   Subjective:    Patient ID: Gregory Evans, male    DOB: [birth year], 58 y.o.   MRN: [id number]  HPI This 58 y.o. male presents for evaluation of left ankle pain and discomfort.  He denies any injury. He states he woke up with this pain and it feels ok when he is off his feet but when he stands the pain is severe.  He has had dx of gout in past and was rx'd some indocin but it has not been helping.   Review of Systems C/o left ankle pain No chest pain, SOB, HA, dizziness, vision change, N/V, diarrhea, constipation, dysuria, urinary urgency or frequency, myalgias, arthralgias or rash.     Objective:   Physical Exam Vital signs noted  Well developed well nourished male.  HEENT - Head atraumatic Normocephalic                Eyes - PERRLA, Conjuctiva - clear Sclera- Clear EOMI                Ears - EAC's Wnl TM's Wnl Gross Hearing WNL                Throat - oropharanx wnl Respiratory - Lungs CTA bilateral Cardiac - RRR S1 and S2 without murmur GI - Abdomen soft Nontender and bowel sounds active x 4 Extremities - No edema. Neuro - Grossly intact. MS - TTP left medial malleolous  Xray of left foot - no fx Prelimnary reading by Gwyndolyn Saxon [place],FNP      Assessment & Plan:  Pain in joint, ankle and foot, left - Plan: DG Ankle Complete Left, HYDROcodone-acetaminophen (NORCO) 5-325 MG per tablet, ketorolac (TORADOL) 30 MG/ML injection 30 mg, methylPREDNIsolone (MEDROL DOSPACK) 4 MG tablet  Acute idiopathic gout, unspecified site - Plan: HYDROcodone-acetaminophen (NORCO) 5-325 MG per tablet, ketorolac (TORADOL) 30 MG/ML injection 30 mg, methylPREDNIsolone (MEDROL DOSPACK) 4 MG tablet  Lysbeth Penner FNP

## 2014-09-08 ENCOUNTER — Telehealth: Payer: Self-pay

## 2014-09-08 ENCOUNTER — Other Ambulatory Visit: Payer: Self-pay | Admitting: Family Medicine

## 2014-09-08 DIAGNOSIS — S82892A Other fracture of left lower leg, initial encounter for closed fracture: Secondary | ICD-10-CM

## 2014-09-08 NOTE — Telephone Encounter (Signed)
[facility] to x-ray 

## 2014-12-25 ENCOUNTER — Other Ambulatory Visit: Payer: Self-pay | Admitting: Family

## 2015-02-16 ENCOUNTER — Other Ambulatory Visit: Payer: Self-pay | Admitting: *Deleted

## 2015-02-16 DIAGNOSIS — N529 Male erectile dysfunction, unspecified: Secondary | ICD-10-CM

## 2015-02-16 MED ORDER — SILDENAFIL CITRATE 20 MG PO TABS: 20.0000 mg | ORAL_TABLET | Freq: Three times a day (TID) | ORAL | Status: DC

## 2015-02-27 ENCOUNTER — Other Ambulatory Visit: Payer: Self-pay | Admitting: Family Medicine

## 2015-03-19 ENCOUNTER — Ambulatory Visit (INDEPENDENT_AMBULATORY_CARE_PROVIDER_SITE_OTHER): Payer: BLUE CROSS/BLUE SHIELD | Admitting: Family Medicine

## 2015-03-19 ENCOUNTER — Encounter: Payer: Self-pay | Admitting: Family Medicine

## 2015-03-19 VITALS — BP 120/65 | HR 64 | Temp 98.6°F | Ht 71.5 in | Wt 265.8 lb

## 2015-03-19 DIAGNOSIS — I10 Essential (primary) hypertension: Secondary | ICD-10-CM

## 2015-03-19 DIAGNOSIS — N528 Other male erectile dysfunction: Secondary | ICD-10-CM | POA: Diagnosis not present

## 2015-03-19 DIAGNOSIS — E559 Vitamin D deficiency, unspecified: Secondary | ICD-10-CM

## 2015-03-19 DIAGNOSIS — M25572 Pain in left ankle and joints of left foot: Secondary | ICD-10-CM

## 2015-03-19 DIAGNOSIS — E785 Hyperlipidemia, unspecified: Secondary | ICD-10-CM

## 2015-03-19 DIAGNOSIS — R5383 Other fatigue: Secondary | ICD-10-CM | POA: Diagnosis not present

## 2015-03-19 DIAGNOSIS — J302 Other seasonal allergic rhinitis: Secondary | ICD-10-CM | POA: Diagnosis not present

## 2015-03-19 DIAGNOSIS — N529 Male erectile dysfunction, unspecified: Secondary | ICD-10-CM

## 2015-03-19 LAB — POCT CBC
Granulocyte percent: 63.9 %G (ref 37–80)
HCT, POC: 41.2 % — AB (ref 43.5–53.7)
Hemoglobin: 13.2 g/dL — AB (ref 14.1–18.1)
Lymph, poc: 2 (ref 0.6–3.4)
MCH, POC: 29.2 pg (ref 27–31.2)
MCHC: 32 g/dL (ref 31.8–35.4)
MCV: 91.1 fL (ref 80–97)
MPV: 10.1 fL (ref 0–99.8)
POC Granulocyte: 3.8 (ref 2–6.9)
POC LYMPH PERCENT: 33.9 %L (ref 10–50)
Platelet Count, POC: 191 10*3/uL (ref 142–424)
RBC: 4.53 M/uL — AB (ref 4.69–6.13)
RDW, POC: 12.2 %
WBC: 6 10*3/uL (ref 4.6–10.2)

## 2015-03-19 MED ORDER — METOPROLOL TARTRATE 100 MG PO TABS: ORAL_TABLET | ORAL | Status: DC

## 2015-03-19 MED ORDER — SILDENAFIL CITRATE 20 MG PO TABS: 20.0000 mg | ORAL_TABLET | Freq: Three times a day (TID) | ORAL | Status: DC

## 2015-03-19 MED ORDER — DICLOFENAC SODIUM 75 MG PO TBEC
75.0000 mg | DELAYED_RELEASE_TABLET | Freq: Two times a day (BID) | ORAL | Status: DC
Start: 2015-03-19 — End: 2015-10-01

## 2015-03-19 MED ORDER — AMLODIPINE BESYLATE 10 MG PO TABS: 10.0000 mg | ORAL_TABLET | Freq: Every day | ORAL | Status: DC

## 2015-03-19 NOTE — Progress Notes (Signed)
Subjective:  Patient ID: Gregory Evans, male    DOB: [birth year]  Age: 60 y.o. MRN: [id number]  CC: Hypertension; Hyperlipidemia; Vit D deficiency; and Erectile Dysfunction   HPI Gregory Evans presents for  follow-up of hypertension. Patient has no history of headache chest pain or shortness of breath or recent cough. Patient also denies symptoms of TIA such as numbness weakness lateralizing. Patient checks  blood pressure at home and has not had any elevated readings recently. Patient denies side effects from his medication. States taking it regularly.  Patient also  in for follow-up of elevated cholesterol. Doing well without medication. Denies side effects of statin including myalgia and arthralgia and nausea. Also in today for liver function testing. Currently no chest pain, shortness of breath or other cardiovascular related symptoms noted.   His last vitamin D check was June of last year and it was significantly low. Since that time he's been taking 2000 units daily. He is due to have his level rechecked today. History Gregory Evans has a past medical history of Hypertension; Colon polyp (2008); Allergy; Vitamin D deficiency; Osteoarthritis; Erectile dysfunction; and Hyperlipidemia.   He has no past surgical history on file.   His family history includes Diabetes (age of onset: 51) in his father; Hypertension in his mother.He reports that he quit smoking about 21 years ago. He has never used smokeless tobacco. He reports that he drinks about 8.4 oz of alcohol per week. He reports that he does not use illicit drugs.  Current Outpatient Prescriptions on File Prior to Visit  Medication Sig Dispense Refill  . Cholecalciferol 1000 UNITS tablet Take 2 tablets (2,000 Units total) by mouth daily.    Marland Kitchen levocetirizine (XYZAL) 5 MG tablet TAKE 1 TABLET BY MOUTH EVERY DAY 30 tablet 3  . [DISCONTINUED] nebivolol (BYSTOLIC) 10 MG tablet Take 2 tablets (20 mg total) by mouth daily. 60 tablet 0   No current  facility-administered medications on file prior to visit.    ROS Review of Systems  Constitutional: Negative for fever, chills, diaphoresis and unexpected weight change.  HENT: Negative for congestion, hearing loss, rhinorrhea, sore throat and trouble swallowing.   Respiratory: Negative for cough, chest tightness, shortness of breath and wheezing.   Gastrointestinal: Negative for nausea, vomiting, abdominal pain, diarrhea, constipation and abdominal distention.  Endocrine: Negative for cold intolerance and heat intolerance.  Genitourinary: Negative for dysuria, hematuria and flank pain.  Musculoskeletal: Negative for joint swelling and arthralgias.  Skin: Negative for rash.  Allergic/Immunologic: Positive for immunocompromised state.  Neurological: Negative for dizziness and headaches.  Psychiatric/Behavioral: Negative for dysphoric mood, decreased concentration and agitation. The patient is not nervous/anxious.     Objective:  BP 120/65 mmHg  Pulse 64  Temp(Src) 98.6 F (37 C) (Oral)  Ht 5' 11.5" (1.816 m)  Wt 265 lb 12.8 oz (120.566 kg)  BMI 36.56 kg/m2  BP Readings from Last 3 Encounters:  03/19/15 120/65  09/05/14 143/71  07/10/14 141/67    Wt Readings from Last 3 Encounters:  03/19/15 265 lb 12.8 oz (120.566 kg)  09/05/14 267 lb (121.11 kg)  07/10/14 265 lb (120.203 kg)     Physical Exam  Constitutional: He is oriented to person, place, and time. He appears well-developed and well-nourished. No distress.  HENT:  Head: Normocephalic and atraumatic.  Evans Ear: External ear normal.  Left Ear: External ear normal.  Nose: Nose normal.  Mouth/Throat: Oropharynx is clear and moist.  Eyes: Conjunctivae and EOM are normal. Pupils are equal, round,  and reactive to light.  Neck: Normal range of motion. Neck supple. No thyromegaly present.  Cardiovascular: Normal rate, regular rhythm and normal heart sounds.   No murmur heard. Pulmonary/Chest: Effort normal and breath  sounds normal. No respiratory distress. He has no wheezes. He has no rales.  Abdominal: Soft. Bowel sounds are normal. He exhibits no distension. There is no tenderness.  Lymphadenopathy:    He has no cervical adenopathy.  Neurological: He is alert and oriented to person, place, and time. He has normal reflexes.  Skin: Skin is warm and dry.  Psychiatric: He has a normal mood and affect. His behavior is normal. Judgment and thought content normal.    No results found for: HGBA1C  Lab Results  Component Value Date   WBC 6.0 03/19/2015   HGB 13.2* 03/19/2015   HCT 41.2* 03/19/2015   GLUCOSE 102* 05/10/2014   CHOL 165 05/10/2014   TRIG 105 05/10/2014   HDL 51 05/10/2014   LDLCALC 93 05/10/2014   ALT 22 05/10/2014   AST 18 05/10/2014   NA 142 05/10/2014   K 4.2 05/10/2014   CL 102 05/10/2014   CREATININE 0.96 05/10/2014   BUN 20 05/10/2014   CO2 23 05/10/2014   PSA 2.1 05/10/2014    US Venous Img Lower Unilateral Evans  11/28/2011   *RADIOLOGY REPORT*  Clinical Data: Evans leg pain.  Evans LOWER EXTREMITY VENOUS DUPLEX ULTRASOUND  Technique:  Gray-scale sonography with graded compression, as well as color Doppler and duplex ultrasound were performed to evaluate the deep venous system of the lower extremity from the level of the common femoral vein through the popliteal and proximal calf veins. Spectral Doppler was utilized to evaluate flow at rest and with distal augmentation maneuvers.  Comparison:  None.  Findings:  Normal compressibility of the common femoral, superficial femoral, and popliteal veins is demonstrated, as well as the visualized proximal calf veins.  No filling defects to suggest DVT on grayscale or color Doppler imaging.  Doppler waveforms show normal direction of venous flow, normal respiratory phasicity and response to augmentation.  IMPRESSION: No evidence of lower extremity deep vein thrombosis.  Original Report Authenticated By: Gregory Evans. Gregory Evans,  M.D.   Assessment & Plan:   [name] was seen today for hypertension, hyperlipidemia, vit d deficiency and erectile dysfunction.  Diagnoses and all orders for this visit:  Essential hypertension Orders: -     POCT CBC; Standing -     CMP14+EGFR; Standing -     POCT CBC -     CMP14+EGFR -     amLODipine (NORVASC) 10 MG tablet; Take 1 tablet (10 mg total) by mouth daily.  Vitamin D deficiency Orders: -     Vit D  25 hydroxy (rtn osteoporosis monitoring); Standing -     Vit D  25 hydroxy (rtn osteoporosis monitoring)  Other male erectile dysfunction  Hyperlipidemia Orders: -     CMP14+EGFR; Standing -     Lipid panel; Standing -     CMP14+EGFR -     Lipid panel  Other fatigue Orders: -     POCT CBC; Standing -     Thyroid Panel With TSH -     Vit D  25 hydroxy (rtn osteoporosis monitoring); Standing -     POCT CBC -     Vit D  25 hydroxy (rtn osteoporosis monitoring)  Erectile dysfunction, unspecified erectile dysfunction type Orders: -     sildenafil (REVATIO) 20 MG tablet; Take 1 tablet (  20 mg total) by mouth 3 (three) times daily.  Seasonal allergies  Pain in joint, ankle and foot, left Orders: -     Ambulatory referral to Physical Therapy -     MR Ankle Left  Wo Contrast; Future -     Apply ASO ankle -     DME Other see comment  Other orders -     metoprolol (LOPRESSOR) 100 MG tablet; TAKE 1 TABLET (100 MG TOTAL) BY MOUTH 2 (TWO) TIMES DAILY. -     diclofenac (VOLTAREN) 75 MG EC tablet; Take 1 tablet (75 mg total) by mouth 2 (two) times daily.   I have discontinued Mr. Lacko indomethacin, ibuprofen, HYDROcodone-acetaminophen, and methylPREDNIsolone. I am also having him start on diclofenac. Additionally, I am having him maintain his levocetirizine, Cholecalciferol, amLODipine, metoprolol, and sildenafil.  Meds ordered this encounter  Medications  . amLODipine (NORVASC) 10 MG tablet    Sig: Take 1 tablet (10 mg total) by mouth daily.    Dispense:  30  tablet    Refill:  11  . metoprolol (LOPRESSOR) 100 MG tablet    Sig: TAKE 1 TABLET (100 MG TOTAL) BY MOUTH 2 (TWO) TIMES DAILY.    Dispense:  60 tablet    Refill:  11  . sildenafil (REVATIO) 20 MG tablet    Sig: Take 1 tablet (20 mg total) by mouth 3 (three) times daily.    Dispense:  90 tablet    Refill:  2  . diclofenac (VOLTAREN) 75 MG EC tablet    Sig: Take 1 tablet (75 mg total) by mouth 2 (two) times daily.    Dispense:  60 tablet    Refill:  2     Follow-up: Return in about 6 months (around 09/19/2015).  Claretta Fraise, M.D.

## 2015-03-20 ENCOUNTER — Other Ambulatory Visit: Payer: Self-pay | Admitting: Family Medicine

## 2015-03-20 LAB — CMP14+EGFR
ALT: 29 IU/L (ref 0–44)
AST: 21 IU/L (ref 0–40)
Albumin/Globulin Ratio: 1.6 (ref 1.1–2.5)
Albumin: 4.2 g/dL (ref 3.6–4.8)
Alkaline Phosphatase: 34 IU/L — ABNORMAL LOW (ref 39–117)
BUN/Creatinine Ratio: 18 (ref 10–22)
BUN: 19 mg/dL (ref 8–27)
Bilirubin Total: 0.7 mg/dL (ref 0.0–1.2)
CO2: 24 mmol/L (ref 18–29)
Calcium: 9.8 mg/dL (ref 8.6–10.2)
Chloride: 100 mmol/L (ref 97–108)
Creatinine, Ser: 1.06 mg/dL (ref 0.76–1.27)
GFR calc Af Amer: 88 mL/min/{1.73_m2} (ref >59)
GFR calc non Af Amer: 76 mL/min/{1.73_m2} (ref >59)
Globulin, Total: 2.7 g/dL (ref 1.5–4.5)
Glucose: 89 mg/dL (ref 65–99)
Potassium: 4.6 mmol/L (ref 3.5–5.2)
Sodium: 143 mmol/L (ref 134–144)
Total Protein: 6.9 g/dL (ref 6.0–8.5)

## 2015-03-20 LAB — THYROID PANEL WITH TSH
Free Thyroxine Index: 2.1 (ref 1.2–4.9)
T3 Uptake Ratio: 29 % (ref 24–39)
T4, Total: 7.1 ug/dL (ref 4.5–12.0)
TSH: 1.21 u[IU]/mL (ref 0.450–4.500)

## 2015-03-20 LAB — LIPID PANEL
Chol/HDL Ratio: 3.1 ratio units (ref 0.0–5.0)
Cholesterol, Total: 159 mg/dL (ref 100–199)
HDL: 52 mg/dL (ref >39)
LDL Calculated: 72 mg/dL (ref 0–99)
Triglycerides: 176 mg/dL — ABNORMAL HIGH (ref 0–149)
VLDL Cholesterol Cal: 35 mg/dL (ref 5–40)

## 2015-03-20 LAB — VITAMIN D 25 HYDROXY (VIT D DEFICIENCY, FRACTURES): Vit D, 25-Hydroxy: 26.4 ng/mL — ABNORMAL LOW (ref 30.0–100.0)

## 2015-03-20 MED ORDER — VITAMIN D (ERGOCALCIFEROL) 1.25 MG (50000 UNIT) PO CAPS: 50000.0000 [IU] | ORAL_CAPSULE | ORAL | Status: DC

## 2015-03-21 ENCOUNTER — Telehealth: Payer: Self-pay | Admitting: *Deleted

## 2015-03-21 NOTE — Telephone Encounter (Signed)
-----   Message from Claretta Fraise, MD sent at 03/20/2015  3:31 PM EDT ----- Dear Warren Lacy,   Your Vitamin D is quite low. You need a high dose supplement. Use 50,000 units twice weekly for two months. I will send that in as a 1 time only prescription.  Then switch to OTC Vitamin D 2000 units daily. Recheck vitamin D level with office visit in 6 months.  Best regards, Claretta Fraise M.D.

## 2015-03-21 NOTE — Telephone Encounter (Signed)
lmtcb regarding test results. 

## 2015-03-21 NOTE — Progress Notes (Signed)
Patient aware. Will pick up rx °

## 2015-03-29 ENCOUNTER — Ambulatory Visit (HOSPITAL_COMMUNITY)
Admission: RE | Admit: 2015-03-29 | Discharge: 2015-03-29 | Disposition: A | Discharge status: Home / Self Care | Payer: BLUE CROSS/BLUE SHIELD | Source: Ambulatory Visit | Attending: Family Medicine | Admitting: Family Medicine

## 2015-03-29 DIAGNOSIS — M25572 Pain in left ankle and joints of left foot: Secondary | ICD-10-CM | POA: Diagnosis present

## 2015-04-03 ENCOUNTER — Ambulatory Visit: Payer: BLUE CROSS/BLUE SHIELD | Attending: Family Medicine | Admitting: Physical Therapy

## 2015-04-03 DIAGNOSIS — M25572 Pain in left ankle and joints of left foot: Secondary | ICD-10-CM | POA: Diagnosis present

## 2015-04-03 DIAGNOSIS — R609 Edema, unspecified: Secondary | ICD-10-CM | POA: Insufficient documentation

## 2015-04-03 DIAGNOSIS — M25473 Effusion, unspecified ankle: Secondary | ICD-10-CM

## 2015-04-03 NOTE — Therapy (Signed)
[facility] [place], [place], [address] Phone: [phone]   Fax:  [phone]  Physical Therapy Evaluation  Patient Details  Name: Gregory Evans MRN: [id number] Date of Birth: 02/17/1956 Referring Provider:  Claretta Fraise, MD  Encounter Date: 04/03/2015      PT End of Session - 04/03/15 1526    Visit Number 1   Number of Visits 12   Date for PT Re-Evaluation 05/29/15   PT Start Time 0240   PT Stop Time 0338   PT Time Calculation (min) 58 min   Activity Tolerance Patient tolerated treatment well   Behavior During Therapy [facility] for tasks assessed/performed      Past Medical History  Diagnosis Date  . Hypertension   . Colon polyp 2008  . Allergy   . Vitamin D deficiency   . Osteoarthritis   . Erectile dysfunction   . Hyperlipidemia     No past surgical history on file.  There were no vitals filed for this visit.  Visit Diagnosis:  Left ankle pain - Plan: PT plan of care cert/re-cert  Ankle edema - Plan: PT plan of care cert/re-cert      Subjective Assessment - 04/03/15 1458    Subjective My left ankle just started hurting.  A lace up brace helps a lot.     Limitations Walking;Standing   How long can you stand comfortably? 30 minutes.   How long can you walk comfortably? 30 minutes.   Patient Stated Goals Would like to eliminate my left ankle pain and swelling.   Currently in Pain? Yes   Pain Score 6    Pain Location Ankle   Pain Type --  Sub-acute.   Pain Onset More than a month ago   Pain Frequency Constant   Aggravating Factors  Walking and standing.   Pain Relieving Factors Staying off feet.            [facility] PT Assessment - 04/03/15 0001    Assessment   Medical Diagnosis Left ankle pain.   Onset Date --  4 months.   Precautions   Precautions None   Balance Screen   Has the patient fallen in the past 6 months No   Has the patient had a decrease in activity level because of a fear of  falling?  No   Is the patient reluctant to leave their home because of a fear of falling?  No   ROM / Strength   AROM / PROM / Strength AROM;Strength   AROM   Overall AROM Comments Full active left ankle range of motion.   Strength   Overall Strength Comments --  4+/5 for left ankle eversion.   Palpation   Palpation Tender posterior to left lateral malleolus and base of 5th metatarsal.   Special Tests    Special Tests --  Moderate- edema lateral left malleolar region.   Ambulation/Gait   Ambulation/Gait --  Normal gait cycle.                   [facility] Adult PT Treatment/Exercise - 04/03/15 0001    Modalities   Modalities Cryotherapy;Electrical Stimulation;Iontophoresis  Medium vasopneumatic x 20 minutes.   Acupuncturist Location --  Left lateral ankle.   Electrical Stimulation Action --  80-150 HZ x 20 minutes--constant.   Electrical Stimulation Goals Pain   Iontophoresis   Type of Iontophoresis Dexamethasone   Location Left lateral ankle   Dose 28mA-Min 4mg /ml-patch  Time 8 minutes.                     PT Long Term Goals - 04/03/15 1535    PT LONG TERM GOAL #1   Title Independent with an HEP.   Time 6   Period Weeks   Status New   PT LONG TERM GOAL #2   Title 5/5 left ankle eversion strength to increase stability for functional tasks.   Time 6   Period Weeks   Status New   PT LONG TERM GOAL #3   Title Perform ADL's with pain not > 2-3/10.   Time 6   Period Weeks   Status New   PT LONG TERM GOAL #4   Title Stand 45 minutes with pain not > 3/10.   Time 6   Period Weeks   Status New   PT LONG TERM GOAL #5   Title Walk a comunity distance with pain not > 3/10.   Time 6   Period Weeks   Status New               Plan - 04/03/15 1527    Clinical Impression Statement The patient reports he began to experience left ankle pain approximately 4 months ago for no apparent reason.  His CC is that of  lateral left ankle pain.  An MRI revealed tenosynovitis.  He states he uses a lace up brace with support straps which helps a lot.  His current pain-level is a 6/10 but can rise to 7+/10 by the end of a workshift at which time his ankle swells quite a bit.   Pt will benefit from skilled therapeutic intervention in order to improve on the following deficits Decreased activity tolerance;Pain;Increased edema   Rehab Potential Excellent   PT Frequency 2x / week   PT Duration 6 weeks   PT Treatment/Interventions Moist Heat;Therapeutic activities;Patient/family education;Therapeutic exercise;Manual techniques;Ultrasound;Electrical Stimulation   PT Next Visit Plan IASTM to left lateral ankle; U/S; E'Stim and Iontophoresis patch.  Left ankle strengthening and T-band for HEP.         Problem List Patient Active Problem List   Diagnosis Date Noted  . Obese 08/19/2013  . Hypertension   . Colon polyp   . Allergy   . Vitamin D deficiency   . Osteoarthritis   . Erectile dysfunction   . Hyperlipidemia     Myiesha Edgar, [name] 04/03/2015, 3:49 PM  [facility] [address] [place], [place], [address] Phone: [phone]   Fax:  [phone]

## 2015-04-05 ENCOUNTER — Ambulatory Visit: Payer: BLUE CROSS/BLUE SHIELD | Admitting: Physical Therapy

## 2015-04-05 ENCOUNTER — Telehealth: Payer: Self-pay | Admitting: *Deleted

## 2015-04-05 DIAGNOSIS — M25572 Pain in left ankle and joints of left foot: Secondary | ICD-10-CM

## 2015-04-05 DIAGNOSIS — M25473 Effusion, unspecified ankle: Secondary | ICD-10-CM

## 2015-04-05 NOTE — Therapy (Signed)
[facility] [place], [place], [address] Phone: [phone]   Fax:  [phone]  Physical Therapy Treatment  Patient Details  Name: Gregory Evans MRN: [id number] Date of Birth: 1956/06/17 Referring Provider:  Sharion Balloon, FNP  Encounter Date: 04/05/2015      PT End of Session - 04/05/15 1740    Visit Number 2   Number of Visits 12   Date for PT Re-Evaluation 05/29/15   PT Start Time 0445   PT Stop Time 0539   PT Time Calculation (min) 54 min   Activity Tolerance Patient tolerated treatment well   Behavior During Therapy [facility] for tasks assessed/performed      Past Medical History  Diagnosis Date  . Hypertension   . Colon polyp 2008  . Allergy   . Vitamin D deficiency   . Osteoarthritis   . Erectile dysfunction   . Hyperlipidemia     No past surgical history on file.  There were no vitals filed for this visit.  Visit Diagnosis:  Left ankle pain  Ankle edema      Subjective Assessment - 04/05/15 1658    Subjective No new complaints.  Did well after first treatment.   Limitations Walking;Standing   How long can you stand comfortably? 30 minutes.   How long can you walk comfortably? 30 minutes.   Patient Stated Goals Would like to eliminate my left ankle pain and swelling.   Pain Score 5    Pain Location Ankle   Pain Onset More than a month ago                         [facility] Adult PT Treatment/Exercise - 04/05/15 1737    Modalities   Modalities Ultrasound   Cryotherapy   Number Minutes Cryotherapy 15 Minutes   Type of Cryotherapy --  Medium vasopneumatic.   Electrical Stimulation   Electrical Stimulation Location Left lateral ankle.   Electrical Stimulation Action Constant pre-mod x 15 minutes @ 80-150HZ .   Electrical Stimulation Goals Pain   Ultrasound   Ultrasound Location --  Left lateral ankle x 8 minutes.   Ultrasound Parameters --  50% pulsed U/S at 1.50 W/CM2 x 8  minutes.   Iontophoresis   Time 8 minutes.   Manual Therapy   Manual Therapy --  STW/M x 10 minutes.                     PT Long Term Goals - 04/03/15 1535    PT LONG TERM GOAL #1   Title Independent with an HEP.   Time 6   Period Weeks   Status New   PT LONG TERM GOAL #2   Title 5/5 left ankle eversion strength to increase stability for functional tasks.   Time 6   Period Weeks   Status New   PT LONG TERM GOAL #3   Title Perform ADL's with pain not > 2-3/10.   Time 6   Period Weeks   Status New   PT LONG TERM GOAL #4   Title Stand 45 minutes with pain not > 3/10.   Time 6   Period Weeks   Status New   PT LONG TERM GOAL #5   Title Walk a comunity distance with pain not > 3/10.   Time 6   Period Weeks   Status New  Problem List Patient Active Problem List   Diagnosis Date Noted  . Obese 08/19/2013  . Hypertension   . Colon polyp   . Allergy   . Vitamin D deficiency   . Osteoarthritis   . Erectile dysfunction   . Hyperlipidemia     Izmael Duross, [name] 04/05/2015, 5:45 PM  [facility] [address] [place], [place], [address] Phone: [phone]   Fax:  [phone]

## 2015-04-05 NOTE — Telephone Encounter (Signed)
Pt notified of results Verbalizes understanding 

## 2015-04-05 NOTE — Telephone Encounter (Signed)
-----   Message from Claretta Fraise, MD sent at 03/30/2015  8:17 PM EDT ----- Total patient that his MRI showed significant tendinitis and edema. There is a small lesion that remains unexplained. For that reason I believe you should see an orthopedist and have him examine you and review the MRI.  Nurse, please refer.

## 2015-04-10 ENCOUNTER — Encounter: Payer: Self-pay | Admitting: *Deleted

## 2015-04-10 ENCOUNTER — Ambulatory Visit: Payer: BLUE CROSS/BLUE SHIELD | Admitting: *Deleted

## 2015-04-10 DIAGNOSIS — M25572 Pain in left ankle and joints of left foot: Secondary | ICD-10-CM

## 2015-04-10 DIAGNOSIS — M25473 Effusion, unspecified ankle: Secondary | ICD-10-CM

## 2015-04-10 NOTE — Therapy (Signed)
[facility] [place], [place], [address] Phone: [phone]   Fax:  [phone]  Physical Therapy Treatment  Patient Details  Name: Gregory Evans MRN: [id number] Date of Birth: November 20, 1955 Referring Provider:  Sharion Balloon, FNP  Encounter Date: 04/10/2015      PT End of Session - 04/10/15 1708    Visit Number 3   Number of Visits 12   Date for PT Re-Evaluation 05/29/15   PT Start Time 1600   PT Stop Time 1651   PT Time Calculation (min) 51 min      Past Medical History  Diagnosis Date  . Hypertension   . Colon polyp 2008  . Allergy   . Vitamin D deficiency   . Osteoarthritis   . Erectile dysfunction   . Hyperlipidemia     History reviewed. No pertinent past surgical history.  There were no vitals filed for this visit.  Visit Diagnosis:  Left ankle pain  Ankle edema      Subjective Assessment - 04/10/15 1602    Subjective Did good after last Rx.   Limitations Walking;Standing   How long can you stand comfortably? 30 minutes.   How long can you walk comfortably? 30 minutes.   Patient Stated Goals Would like to eliminate my left ankle pain and swelling.   Currently in Pain? Yes   Pain Score 4    Pain Location Ankle   Pain Orientation Left   Pain Onset More than a month ago   Pain Frequency Intermittent   Aggravating Factors  walking and standing   Pain Relieving Factors rest, PT Rxs                         [facility] Adult PT Treatment/Exercise - 04/10/15 0001    Cryotherapy   Number Minutes Cryotherapy 15 Minutes   Cryotherapy Location Ankle   Type of Cryotherapy Ice pack  Vasopnuematic medium   Electrical Stimulation   Electrical Stimulation Location Left lateral ankle.   Electrical Stimulation Action 1-10 hz x15 min   Electrical Stimulation Goals Pain   Ultrasound   Ultrasound Location LT ankle Lateral aspect   Ultrasound Parameters 1.5 w/cm2 x10 min 50% pulsed   Ultrasound  Goals Edema   Manual Therapy   Manual Therapy Myofascial release   Myofascial Release IASTW  around  LT lateral malleoli and into everters and soleus                     PT Long Term Goals - 04/03/15 1535    PT LONG TERM GOAL #1   Title Independent with an HEP.   Time 6   Period Weeks   Status New   PT LONG TERM GOAL #2   Title 5/5 left ankle eversion strength to increase stability for functional tasks.   Time 6   Period Weeks   Status New   PT LONG TERM GOAL #3   Title Perform ADL's with pain not > 2-3/10.   Time 6   Period Weeks   Status New   PT LONG TERM GOAL #4   Title Stand 45 minutes with pain not > 3/10.   Time 6   Period Weeks   Status New   PT LONG TERM GOAL #5   Title Walk a comunity distance with pain not > 3/10.   Time 6   Period Weeks   Status New  Plan - 04/10/15 1713    Clinical Impression Statement Pt did well with Rx today. Notable tightness and soreness above Lateral malleolus and into everters. Felt better after Rx   Pt will benefit from skilled therapeutic intervention in order to improve on the following deficits Decreased activity tolerance;Pain;Increased edema   Rehab Potential Excellent   PT Frequency 2x / week   PT Duration 6 weeks   PT Treatment/Interventions Moist Heat;Therapeutic activities;Patient/family education;Therapeutic exercise;Manual techniques;Ultrasound;Electrical Stimulation   PT Next Visit Plan IASTM to left lateral ankle; U/S; E'Stim and Iontophoresis patch.  Left ankle strengthening and T-band for HEP.        Problem List Patient Active Problem List   Diagnosis Date Noted  . Obese 08/19/2013  . Hypertension   . Colon polyp   . Allergy   . Vitamin D deficiency   . Osteoarthritis   . Erectile dysfunction   . Hyperlipidemia     RAMSEUR,CHRIS, PTA 04/10/2015, 5:22 PM  [facility] [address] [place], [place], [address] Phone:  [phone]   Fax:  [phone]

## 2015-04-14 ENCOUNTER — Other Ambulatory Visit: Payer: Self-pay | Admitting: Family Medicine

## 2015-04-17 ENCOUNTER — Encounter: Payer: BLUE CROSS/BLUE SHIELD | Admitting: *Deleted

## 2015-04-19 ENCOUNTER — Ambulatory Visit: Payer: BLUE CROSS/BLUE SHIELD | Attending: Family Medicine | Admitting: *Deleted

## 2015-04-19 ENCOUNTER — Encounter: Payer: Self-pay | Admitting: *Deleted

## 2015-04-19 DIAGNOSIS — R609 Edema, unspecified: Secondary | ICD-10-CM | POA: Insufficient documentation

## 2015-04-19 DIAGNOSIS — M25473 Effusion, unspecified ankle: Secondary | ICD-10-CM

## 2015-04-19 DIAGNOSIS — M25572 Pain in left ankle and joints of left foot: Secondary | ICD-10-CM | POA: Diagnosis present

## 2015-04-19 NOTE — Therapy (Signed)
[facility] [place], [place], [address] Phone: [phone]   Fax:  [phone]  Physical Therapy Treatment  Patient Details  Name: Tecumseh Yeagley MRN: [id number] Date of Birth: April 25, 1956 Referring Provider:  Sharion Balloon, FNP  Encounter Date: 04/19/2015      PT End of Session - 04/19/15 1750    Visit Number 41   Number of Visits 12   Date for PT Re-Evaluation 05/29/15   PT Start Time 7915   PT Stop Time 1740   PT Time Calculation (min) 55 min      Past Medical History  Diagnosis Date  . Hypertension   . Colon polyp 2008  . Allergy   . Vitamin D deficiency   . Osteoarthritis   . Erectile dysfunction   . Hyperlipidemia     History reviewed. No pertinent past surgical history.  There were no vitals filed for this visit.  Visit Diagnosis:  Left ankle pain  Ankle edema      Subjective Assessment - 04/19/15 1644    Subjective Did good after last Rx.   Limitations Walking;Standing   How long can you stand comfortably? 30 minutes.   How long can you walk comfortably? 30 minutes.   Patient Stated Goals Would like to eliminate my left ankle pain and swelling.   Currently in Pain? Yes   Pain Score 4    Pain Location Ankle   Pain Orientation Left   Pain Onset More than a month ago   Pain Frequency Intermittent   Aggravating Factors  walking/standing   Pain Relieving Factors rest/PT Rxs                         [facility] Adult PT Treatment/Exercise - 04/19/15 0001    Cryotherapy   Number Minutes Cryotherapy 15 Minutes   Cryotherapy Location Ankle   Type of Cryotherapy Ice pack  Vasopnuematic   Electrical Stimulation   Electrical Stimulation Location Left lateral ankle.   Electrical Stimulation Action 1-10hz  x15 min   Electrical Stimulation Goals Pain   Ultrasound   Ultrasound Location LT ankle Lateral aspect   Ultrasound Parameters 1.5 w/cm2 x 10 mins, 39mhz, 50%   Ultrasound Goals Edema    Iontophoresis   Type of Iontophoresis Dexamethasone   Location Left lateral ankle   Dose 6mA-Min 4mg /ml-patch   Time 8 minutes.   Manual Therapy   Manual Therapy Myofascial release   Myofascial Release IASTW  around  LT lateral malleoli and into everters and soleus                PT Education - 04/19/15 1804    Education provided Yes   Education Details yellow Tband for eversion   Person(s) Educated Patient   Methods Explanation;Demonstration;Tactile cues;Verbal cues   Comprehension Returned demonstration;Verbalized understanding             PT Long Term Goals - 04/19/15 1800    PT LONG TERM GOAL #1   Title Independent with an HEP.   Time 6   Period Weeks   Status On-going   PT LONG TERM GOAL #2   Title 5/5 left ankle eversion strength to increase stability for functional tasks.   Time 6   Period Weeks   Status On-going   PT LONG TERM GOAL #3   Title Perform ADL's with pain not > 2-3/10.   Time 6   Period Weeks   Status On-going   PT  LONG TERM GOAL #4   Title Stand 45 minutes with pain not > 3/10.   Time 6   Period Weeks   Status On-going   PT LONG TERM GOAL #5   Title Walk a comunity distance with pain not > 3/10.               Plan - 04/19/15 1752    Clinical Impression Statement Pt did well with Rx today and was able to tol. eversion exs for HEP. He still has some swelling and soreness lateral aspect of LT ankle and above. His DF ROM was 14 degrees.Current goals are on-going    Pt will benefit from skilled therapeutic intervention in order to improve on the following deficits Decreased activity tolerance;Pain;Increased edema   Rehab Potential Excellent   PT Frequency 2x / week   PT Duration 6 weeks   PT Treatment/Interventions Iontophoresis 4mg /ml Dexamethasone   PT Next Visit Plan IASTM to left lateral ankle; U/S; E'Stim and Iontophoresis patch.  Left ankle strengthening and T-band for HEP.   Consulted and Agree with Plan of Care  Patient        Problem List Patient Active Problem List   Diagnosis Date Noted  . Obese 08/19/2013  . Hypertension   . Colon polyp   . Allergy   . Vitamin D deficiency   . Osteoarthritis   . Erectile dysfunction   . Hyperlipidemia     Solangel Mcmanaway,CHRIS, PTA 04/19/2015, 6:05 PM  [facility] [address] [place], [place], [address] Phone: [phone]   Fax:  [phone]

## 2015-04-23 ENCOUNTER — Ambulatory Visit: Payer: BLUE CROSS/BLUE SHIELD | Admitting: *Deleted

## 2015-04-23 ENCOUNTER — Encounter: Payer: Self-pay | Admitting: *Deleted

## 2015-04-23 DIAGNOSIS — M25572 Pain in left ankle and joints of left foot: Secondary | ICD-10-CM | POA: Diagnosis not present

## 2015-04-23 DIAGNOSIS — M25473 Effusion, unspecified ankle: Secondary | ICD-10-CM

## 2015-04-23 NOTE — Therapy (Signed)
[facility] [place], [place], [address] Phone: [phone]   Fax:  [phone]  Physical Therapy Treatment  Patient Details  Name: Gregory Evans MRN: [id number] Date of Birth: Dec 31, 1955 Referring Provider:  Sharion Balloon, FNP  Encounter Date: 04/23/2015      PT End of Session - 04/23/15 1648    Visit Number 5   Number of Visits 12   Date for PT Re-Evaluation 05/29/15   PT Start Time 9242   PT Stop Time 1655   PT Time Calculation (min) 54 min      Past Medical History  Diagnosis Date  . Hypertension   . Colon polyp 2008  . Allergy   . Vitamin D deficiency   . Osteoarthritis   . Erectile dysfunction   . Hyperlipidemia     History reviewed. No pertinent past surgical history.  There were no vitals filed for this visit.  Visit Diagnosis:  Left ankle pain  Ankle edema      Subjective Assessment - 04/23/15 1558    Subjective Did good after last Rx. LT ankle swelling is less.    Limitations Walking;Standing   How long can you stand comfortably? 30 minutes.   How long can you walk comfortably? 30 minutes.   Patient Stated Goals Would like to eliminate my left ankle pain and swelling. No meds in several days   Currently in Pain? Yes   Pain Score 2    Pain Location Ankle   Pain Orientation Left   Pain Descriptors / Indicators Aching   Pain Onset More than a month ago   Pain Frequency Intermittent   Aggravating Factors  walking/ standing   Pain Relieving Factors rest/PT Rxs            [facility] PT Assessment - 04/23/15 0001    Observation/Other Assessments-Edema    Edema Circumferential   Circumferential Edema   Circumferential - Left  31 cm Malleoli                     [facility] Adult PT Treatment/Exercise - 04/23/15 0001    Exercises   Exercises Ankle   Cryotherapy   Number Minutes Cryotherapy 15 Minutes   Cryotherapy Location Ankle   Type of Cryotherapy Ice pack  Vasopnuematic x15  min medium pressure   Electrical Stimulation   Electrical Stimulation Location Left lateral ankle.   Electrical Stimulation Action 1-10 hz x15 min in supine   Electrical Stimulation Goals Pain   Ultrasound   Ultrasound Location LT ankle Lateral aspect   Ultrasound Parameters 1.5 w/cm2 x 10 min   Ultrasound Goals Edema   Iontophoresis   Type of Iontophoresis Dexamethasone   Location Left lateral ankle   Dose 30mA-Min 4mg /ml-patch   Time 8 minutes.   Manual Therapy   Manual Therapy Myofascial release   Myofascial Release IASTW  around  LT lateral malleoli and into everters and soleus   Ankle Exercises: Standing   Rocker Board 4 minutes  calf stretching   Other Standing Ankle Exercises Dyna disc DF/PF, circles each way RT ankle to fatigue                      PT Long Term Goals - 04/23/15 1654    PT LONG TERM GOAL #4   Title Stand 45 minutes with pain not > 3/10.   Period Weeks   Status Achieved  Plan - 04/23/15 1714    Clinical Impression Statement Pt had less swelling in LT ankle today (31 cm circumference). He was able to perform ankle exs today without increased pain and did well. Decreased tightness/soreness in everters during STW   Pt will benefit from skilled therapeutic intervention in order to improve on the following deficits Decreased activity tolerance;Pain;Increased edema   Rehab Potential Excellent   PT Frequency 2x / week   PT Duration 6 weeks   PT Treatment/Interventions Iontophoresis 4mg /ml Dexamethasone   PT Next Visit Plan IASTM to left lateral ankle; U/S; E'Stim and Iontophoresis patch.  Left ankle strengthening and T-band for HEP. Dyna disc, rocker board        Problem List Patient Active Problem List   Diagnosis Date Noted  . Obese 08/19/2013  . Hypertension   . Colon polyp   . Allergy   . Vitamin D deficiency   . Osteoarthritis   . Erectile dysfunction   . Hyperlipidemia     Kellyanne Ellwanger,CHRIS, PTA 04/23/2015,  5:30 PM  [facility] [address] [place], [place], [address] Phone: [phone]   Fax:  [phone]

## 2015-04-26 ENCOUNTER — Ambulatory Visit: Payer: BLUE CROSS/BLUE SHIELD | Admitting: *Deleted

## 2015-04-26 ENCOUNTER — Encounter: Payer: Self-pay | Admitting: *Deleted

## 2015-04-26 DIAGNOSIS — M25473 Effusion, unspecified ankle: Secondary | ICD-10-CM

## 2015-04-26 DIAGNOSIS — M25572 Pain in left ankle and joints of left foot: Secondary | ICD-10-CM

## 2015-04-26 NOTE — Therapy (Signed)
[facility] [place], [place], [address] Phone: [phone]   Fax:  [phone]  Physical Therapy Treatment  Patient Details  Name: Gregory Evans MRN: [id number] Date of Birth: Aug 18, 1956 Referring Provider:  Sharion Balloon, FNP  Encounter Date: 04/26/2015      PT End of Session - 04/26/15 1613    Visit Number 6   Number of Visits 12   Date for PT Re-Evaluation 05/29/15   PT Start Time 1600   PT Stop Time 1656   PT Time Calculation (min) 56 min      Past Medical History  Diagnosis Date  . Hypertension   . Colon polyp 2008  . Allergy   . Vitamin D deficiency   . Osteoarthritis   . Erectile dysfunction   . Hyperlipidemia     History reviewed. No pertinent past surgical history.  There were no vitals filed for this visit.  Visit Diagnosis:  Left ankle pain  Ankle edema      Subjective Assessment - 04/26/15 1615    Subjective Did good after last Rx. LT ankle swelling is less. No swelling today and no brace for 3 days. Minimal pain today   Limitations Walking;Standing   How long can you stand comfortably? 30 minutes.   How long can you walk comfortably? 30 minutes.   Patient Stated Goals Would like to eliminate my left ankle pain and swelling. No meds in several days   Currently in Pain? Yes   Pain Score 1    Pain Location Ankle   Pain Orientation Left   Pain Descriptors / Indicators Aching   Pain Onset More than a month ago   Pain Frequency Intermittent   Aggravating Factors  walking/ standing   Pain Relieving Factors rest/ PT Rxs                         [facility] Adult PT Treatment/Exercise - 04/26/15 0001    Exercises   Exercises Ankle   Cryotherapy   Number Minutes Cryotherapy 15 Minutes   Cryotherapy Location Ankle   Type of Cryotherapy Ice pack  Vasopnuematic medium   Electrical Stimulation   Electrical Stimulation Location Left lateral ankle.   Electrical Stimulation Action  1-10 hz x15 min with Vaso   Electrical Stimulation Goals Pain   Ultrasound   Ultrasound Location LT ankle   Ultrasound Parameters 1.5 w/cm2 x10 mins   Ultrasound Goals Pain   Iontophoresis   Type of Iontophoresis Dexamethasone   Location Left lateral ankle   Dose 96m-Min 47mml-patch   Time 8 minutes.   Manual Therapy   Manual Therapy Myofascial release   Myofascial Release IASTW  around  LT lateral malleoli and into everters and soleus   Ankle Exercises: Standing   Rocker Board 4 minutes  calf stretching   Other Standing Ankle Exercises Dyna disc DF/PF, circles each way RT ankle to fatigue                      PT Long Term Goals - 04/26/15 1726    PT LONG TERM GOAL #1   Title Independent with an HEP.   Time 6   Period Weeks   Status On-going   PT LONG TERM GOAL #2   Title 5/5 left ankle eversion strength to increase stability for functional tasks.   Time 6   Period Weeks   Status On-going   PT LONG TERM GOAL #3  Title Perform ADL's with pain not > 2-3/10.   Time 6   Period Weeks   Status On-going   PT LONG TERM GOAL #4   Title Stand 45 minutes with pain not > 3/10.   Period Weeks   Status Achieved   PT LONG TERM GOAL #5   Title Walk a comunity distance with pain not > 3/10.   Time 6   Period Weeks   Status Achieved               Plan - 04/26/15 1715    Clinical Impression Statement Pt did great today with minimal swelling. 31cm around LT ankle compared to RT at 30 cm. LTG met for Edema control. He has been able to go without sleeve foer 3days. Other LTGs are ongoing   Pt will benefit from skilled therapeutic intervention in order to improve on the following deficits Decreased activity tolerance;Pain;Increased edema   Rehab Potential Excellent   PT Frequency 2x / week   PT Duration 6 weeks   PT Treatment/Interventions Iontophoresis 50m/ml Dexamethasone   PT Next Visit Plan IASTM to left lateral ankle; U/S; E'Stim and Iontophoresis patch.   Left ankle strengthening and T-band for HEP. Dyna disc, rocker board   Consulted and Agree with Plan of Care Patient        Problem List Patient Active Problem List   Diagnosis Date Noted  . Obese 08/19/2013  . Hypertension   . Colon polyp   . Allergy   . Vitamin D deficiency   . Osteoarthritis   . Erectile dysfunction   . Hyperlipidemia     RAMSEUR,CHRIS, PTA 04/26/2015, 5:31 PM  [facility] [address]Phone: [phone]  Fax:  [phone]

## 2015-05-01 ENCOUNTER — Ambulatory Visit: Payer: BLUE CROSS/BLUE SHIELD | Admitting: Physical Therapy

## 2015-05-01 DIAGNOSIS — M25572 Pain in left ankle and joints of left foot: Secondary | ICD-10-CM

## 2015-05-01 DIAGNOSIS — M25473 Effusion, unspecified ankle: Secondary | ICD-10-CM

## 2015-05-01 NOTE — Therapy (Signed)
[facility] [place], [place], [address] Phone: [phone]   Fax:  [phone]  Physical Therapy Treatment  Patient Details  Name: Gregory Evans MRN: [id number] Date of Birth: 08-04-1956 Referring Provider:  Sharion Balloon, FNP  Encounter Date: 05/01/2015      PT End of Session - 05/01/15 1712    Visit Number 7   Number of Visits 12   Date for PT Re-Evaluation 05/29/15   PT Start Time 0400   PT Stop Time 0510   PT Time Calculation (min) 70 min   Activity Tolerance Patient tolerated treatment well      Past Medical History  Diagnosis Date  . Hypertension   . Colon polyp 2008  . Allergy   . Vitamin D deficiency   . Osteoarthritis   . Erectile dysfunction   . Hyperlipidemia     No past surgical history on file.  There were no vitals filed for this visit.  Visit Diagnosis:  Left ankle pain  Ankle edema      Subjective Assessment - 05/01/15 1625    Subjective Ankle is doing much better.   Limitations Walking;Standing   How long can you stand comfortably? 30 minutes.   Patient Stated Goals Would like to eliminate my left ankle pain and swelling. No meds in several days   Pain Score 1    Pain Location Ankle   Pain Orientation Left   Pain Descriptors / Indicators Aching   Pain Onset More than a month ago   Pain Frequency Intermittent                                      PT Long Term Goals - 04/26/15 1726    PT LONG TERM GOAL #1   Title Independent with an HEP.   Time 6   Period Weeks   Status On-going   PT LONG TERM GOAL #2   Title 5/5 left ankle eversion strength to increase stability for functional tasks.   Time 6   Period Weeks   Status On-going   PT LONG TERM GOAL #3   Title Perform ADL's with pain not > 2-3/10.   Time 6   Period Weeks   Status On-going   PT LONG TERM GOAL #4   Title Stand 45 minutes with pain not > 3/10.   Period Weeks   Status Achieved    PT LONG TERM GOAL #5   Title Walk a comunity distance with pain not > 3/10.   Time 6   Period Weeks   Status Achieved     In parallel bars:  Rockerboard and dynadisc x 11 minutes f/b constant pre-mod e'stim x 15 minutes to patient's left lateral ankle f/b U/S @ 50% @ 1.50 W/CM2 x 10 minutes f/b IASTM x 13 minutes f/b Ionto with Dex 4mg /ml.  No pain reported after treatment.          Problem List Patient Active Problem List   Diagnosis Date Noted  . Obese 08/19/2013  . Hypertension   . Colon polyp   . Allergy   . Vitamin D deficiency   . Osteoarthritis   . Erectile dysfunction   . Hyperlipidemia     Kairav Russomanno, [name] 05/01/2015, 5:15 PM  [facility] [address] [place], [place], [address] Phone: [phone]   Fax:  [phone]

## 2015-05-03 ENCOUNTER — Ambulatory Visit: Payer: BLUE CROSS/BLUE SHIELD | Admitting: Physical Therapy

## 2015-05-03 DIAGNOSIS — M25473 Effusion, unspecified ankle: Secondary | ICD-10-CM

## 2015-05-03 DIAGNOSIS — M25572 Pain in left ankle and joints of left foot: Secondary | ICD-10-CM | POA: Diagnosis not present

## 2015-05-03 NOTE — Therapy (Signed)
[facility] [place], [place], [address] Phone: [phone]   Fax:  [phone]  Physical Therapy Treatment  Patient Details  Name: Gregory Evans MRN: [id number] Date of Birth: 1956-07-04 Referring Provider:  Sharion Balloon, FNP  Encounter Date: 05/03/2015      PT End of Session - 05/03/15 1704    Visit Number 8   Number of Visits 12   Date for PT Re-Evaluation 05/29/15   PT Start Time 0400   PT Stop Time 0503   PT Time Calculation (min) 63 min   Activity Tolerance Patient tolerated treatment well   Behavior During Therapy Gregory Evans      Past Medical History  Diagnosis Date  . Hypertension   . Colon polyp 2008  . Allergy   . Vitamin D deficiency   . Osteoarthritis   . Erectile dysfunction   . Hyperlipidemia     No past surgical history on file.  There were no vitals filed for this visit.  Visit Diagnosis:  Left ankle pain  Ankle edema      Subjective Assessment - 05/03/15 1704    Subjective Still doing well.                                      PT Long Term Goals - 05/03/15 1715    PT LONG TERM GOAL #1   Title Independent with an HEP.   Time 6   Period Weeks   Status On-going   PT LONG TERM GOAL #2   Title 5/5 left ankle eversion strength to increase stability for functional tasks.   Time 6   Period Weeks   Status On-going   PT LONG TERM GOAL #3   Title Perform ADL's with pain not > 2-3/10.   Time 6   Period Weeks   Status On-going  Close to meeting.   PT LONG TERM GOAL #4   Title Stand 45 minutes with pain not > 3/10.   Period Weeks   Status Achieved     Treatment:  Dynadisc and Rockerboard x 10 minutes. Constant pre-mod e'stim x 15 minutes to left lateral affected ankle region f/b Combo e'stim/U/S @ 50% @ 1.50 W/CM2 x 8 minutes. F/b IASTM x 13 minutes f/b Ionto at 4 mg/ml.          Plan - 05/03/15 1715    Clinical  Impression Statement No pain after treatment today.   PT Next Visit Plan IASTM to left lateral ankle; U/S; E'Stim and Iontophoresis patch.  Left ankle strengthening and T-band for HEP. Dyna disc, rocker board        Problem List Patient Active Problem List   Diagnosis Date Noted  . Obese 08/19/2013  . Hypertension   . Colon polyp   . Allergy   . Vitamin D deficiency   . Osteoarthritis   . Erectile dysfunction   . Hyperlipidemia     Gregory Evans, Gregory Evans 05/03/2015, 5:18 PM  [facility] [address] [place], [place], [address] Phone: [phone]   Fax:  [phone]

## 2015-05-08 ENCOUNTER — Ambulatory Visit: Payer: BLUE CROSS/BLUE SHIELD | Admitting: Physical Therapy

## 2015-05-08 DIAGNOSIS — M25473 Effusion, unspecified ankle: Secondary | ICD-10-CM

## 2015-05-08 DIAGNOSIS — M25572 Pain in left ankle and joints of left foot: Secondary | ICD-10-CM | POA: Diagnosis not present

## 2015-05-08 NOTE — Therapy (Signed)
[facility] [place], [place], [address] Phone: [phone]   Fax:  [phone]  Physical Therapy Treatment  Patient Details  Name: Gregory Evans MRN: [id number] Date of Birth: 1956/03/16 Referring Provider:  Sharion Balloon, FNP  Encounter Date: 05/08/2015      PT End of Session - 05/08/15 1601    Visit Number 9   Number of Visits 12   Date for PT Re-Evaluation 05/29/15   PT Start Time 0358   PT Stop Time 0508   PT Time Calculation (min) 70 min      Past Medical History  Diagnosis Date  . Hypertension   . Colon polyp 2008  . Allergy   . Vitamin D deficiency   . Osteoarthritis   . Erectile dysfunction   . Hyperlipidemia     No past surgical history on file.  There were no vitals filed for this visit.  Visit Diagnosis:  Left ankle pain  Ankle edema      Subjective Assessment - 05/08/15 1600    Subjective Had a good day at work today regarding a lower pain-level. However, last Friday after 11 hours it felt like "fire coming out of my foot."  The patient states the next day, however, he spent the entire day walking at [employer] and did great.   Pain Score 2    Pain Location Ankle   Pain Orientation Left   Pain Descriptors / Indicators Aching   Pain Onset More than a month ago   Pain Frequency Intermittent                         [facility] Adult PT Treatment/Exercise - 05/08/15 1707    Iontophoresis   Type of Iontophoresis Dexamethasone   Location Left lateral ankle   Dose 72mA-Min 4mg /ml-patch   Time 8 minutes.   Manual Therapy   Myofascial Release IASTW  around  LT lateral malleolus/evertors to base of 5th MT x 13 minutes.     Combo E'stim/U/S at 50% at 3.3 Mhz x 10 minutes.  Rockerboard and dynadisc x 10 minutes  Constant Pre-mod e'stim x 17 minutes.                PT Long Term Goals - 05/03/15 1715    PT LONG TERM GOAL #1   Title Independent with an HEP.   Time 6    Period Weeks   Status On-going   PT LONG TERM GOAL #2   Title 5/5 left ankle eversion strength to increase stability for functional tasks.   Time 6   Period Weeks   Status On-going   PT LONG TERM GOAL #3   Title Perform ADL's with pain not > 2-3/10.   Time 6   Period Weeks   Status On-going  Close to meeting.   PT LONG TERM GOAL #4   Title Stand 45 minutes with pain not > 3/10.   Period Weeks   Status Achieved               Problem List Patient Active Problem List   Diagnosis Date Noted  . Obese 08/19/2013  . Hypertension   . Colon polyp   . Allergy   . Vitamin D deficiency   . Osteoarthritis   . Erectile dysfunction   . Hyperlipidemia     Michiah Masse, [place]  MPT  05/08/2015, 5:27 PM  [facility] [address] [place], [place], [address]  Phone: [phone]   Fax:  [phone]

## 2015-05-08 NOTE — Therapy (Signed)
[facility] [place], [place], [address] Phone: [phone]   Fax:  [phone]  Physical Therapy Treatment  Patient Details  Name: Gregory Evans MRN: [id number] Date of Birth: 24-Sep-1956 Referring Provider:  Sharion Balloon, FNP  Encounter Date: 05/08/2015      PT End of Session - 05/08/15 1601    Visit Number 9   Number of Visits 12   Date for PT Re-Evaluation 05/29/15   PT Start Time 0358   PT Stop Time 0508   PT Time Calculation (min) 70 min      Past Medical History  Diagnosis Date  . Hypertension   . Colon polyp 2008  . Allergy   . Vitamin D deficiency   . Osteoarthritis   . Erectile dysfunction   . Hyperlipidemia     No past surgical history on file.  There were no vitals filed for this visit.  Visit Diagnosis:  Left ankle pain  Ankle edema      Subjective Assessment - 05/08/15 1600    Subjective Had a good day at work today regarding a lower pain-level. However, last Friday after 11 hours it felt like "fire coming out of my foot."  The patient states the next day, however, he spent the entire day walking at [employer] and did great.   Pain Score 2    Pain Location Ankle   Pain Orientation Left   Pain Descriptors / Indicators Aching   Pain Onset More than a month ago   Pain Frequency Intermittent                         [facility] Adult PT Treatment/Exercise - 05/08/15 1707    Iontophoresis   Type of Iontophoresis Dexamethasone   Location Left lateral ankle   Dose 15mA-Min 4mg /ml-patch   Time 8 minutes.   Manual Therapy   Myofascial Release IASTW  around  LT lateral malleoli/evertors to base of 5th MT x 13 minutes.     Combo E'stim--U/S at 1.50 W/CM2 x 10 minutes at 50% at 3.3 Mhz  Rockerboard and dynadisc x 10 minutes  Constant pre-mod e'stim x 17 minutes.                PT Long Term Goals - 05/03/15 1715    PT LONG TERM GOAL #1   Title Independent with an  HEP.   Time 6   Period Weeks   Status On-going   PT LONG TERM GOAL #2   Title 5/5 left ankle eversion strength to increase stability for functional tasks.   Time 6   Period Weeks   Status On-going   PT LONG TERM GOAL #3   Title Perform ADL's with pain not > 2-3/10.   Time 6   Period Weeks   Status On-going  Close to meeting.   PT LONG TERM GOAL #4   Title Stand 45 minutes with pain not > 3/10.   Period Weeks   Status Achieved               Problem List Patient Active Problem List   Diagnosis Date Noted  . Obese 08/19/2013  . Hypertension   . Colon polyp   . Allergy   . Vitamin D deficiency   . Osteoarthritis   . Erectile dysfunction   . Hyperlipidemia     Mallisa Alameda, [name] 05/08/2015, 5:23 PM  [facility] [place], [place],  [place] Phone: [phone]   Fax:  [phone]

## 2015-05-08 NOTE — Therapy (Signed)
Gregory Evans, [place], [address] Phone: [phone]   Fax:  [phone]  Physical Therapy Treatment  Patient Details  Name: Gregory Evans MRN: [id number] Date of Birth: [birth year] Referring Provider:  Sharion Balloon, FNP  Encounter Date: 05/08/2015      PT End of Session - 05/08/15 1601    Visit Number 9   Number of Visits 12   Date for PT Re-Evaluation 05/29/15   PT Start Time 0358   PT Stop Time 0508   PT Time Calculation (min) 70 min      Past Medical History  Diagnosis Date  . Hypertension   . Colon polyp 2008  . Allergy   . Vitamin D deficiency   . Osteoarthritis   . Erectile dysfunction   . Hyperlipidemia     No past surgical history on file.  There were no vitals filed for this visit.  Visit Diagnosis:  Left ankle pain  Ankle edema      Subjective Assessment - 05/08/15 1600    Subjective Had a good day at work today regarding a lower pain-level. However, last Friday after 11 hours it felt like "fire coming out of my foot."  The patient states the next day, however, he spent the entire day walking at [employer] and did great.   Pain Score 2    Pain Location Ankle   Pain Orientation Left   Pain Descriptors / Indicators Aching   Pain Onset More than a month ago   Pain Frequency Intermittent                         [facility] Adult PT Treatment/Exercise - 05/08/15 1707    Iontophoresis   Type of Iontophoresis Dexamethasone   Location Left lateral ankle   Dose 5mA-Min 4mg /ml-patch   Time 8 minutes.   Manual Therapy   Myofascial Release IASTW  around  LT lateral malleoli/evertors to base of 5th MT x 13 minutes.                     PT Long Term Goals - 05/03/15 1715    PT LONG TERM GOAL #1   Title Independent with an HEP.   Time 6   Period Weeks   Status On-going   PT LONG TERM GOAL #2   Title 5/5 left ankle eversion strength to increase stability for  functional tasks.   Time 6   Period Weeks   Status On-going   PT LONG TERM GOAL #3   Title Perform ADL's with pain not > 2-3/10.   Time 6   Period Weeks   Status On-going  Close to meeting.   PT LONG TERM GOAL #4   Title Stand 45 minutes with pain not > 3/10.   Period Weeks   Status Achieved               Problem List Patient Active Problem List   Diagnosis Date Noted  . Obese 08/19/2013  . Hypertension   . Colon polyp   . Allergy   . Vitamin D deficiency   . Osteoarthritis   . Erectile dysfunction   . Hyperlipidemia     Kamilya Wakeman, [name] 05/08/2015, 5:19 PM  [facility] [address] [place], [place], [address] Phone: [phone]   Fax:  [phone]

## 2015-05-10 ENCOUNTER — Encounter: Payer: BLUE CROSS/BLUE SHIELD | Admitting: Physical Therapy

## 2015-05-17 ENCOUNTER — Ambulatory Visit: Payer: BLUE CROSS/BLUE SHIELD | Admitting: *Deleted

## 2015-05-22 ENCOUNTER — Encounter: Payer: BLUE CROSS/BLUE SHIELD | Admitting: *Deleted

## 2015-05-24 ENCOUNTER — Encounter: Payer: BLUE CROSS/BLUE SHIELD | Admitting: Physical Therapy

## 2015-06-29 NOTE — Therapy (Signed)
[facility] [place], [place], [address] Phone: [phone]   Fax:  [phone]  Physical Therapy Treatment  Patient Details  Name: Gregory Evans MRN: [id number] Date of Birth: 21-Mar-1956 Referring Provider:  Sharion Balloon, FNP  Encounter Date: 05/08/2015    Past Medical History  Diagnosis Date  . Hypertension   . Colon polyp 2008  . Allergy   . Vitamin D deficiency   . Osteoarthritis   . Erectile dysfunction   . Hyperlipidemia     No past surgical history on file.  There were no vitals filed for this visit.  Visit Diagnosis:  Left ankle pain  Ankle edema                                    PT Long Term Goals - 05/03/15 1715    PT LONG TERM GOAL #1   Title Independent with an HEP.   Time 6   Period Weeks   Status On-going   PT LONG TERM GOAL #2   Title 5/5 left ankle eversion strength to increase stability for functional tasks.   Time 6   Period Weeks   Status On-going   PT LONG TERM GOAL #3   Title Perform ADL's with pain not > 2-3/10.   Time 6   Period Weeks   Status On-going  Close to meeting.   PT LONG TERM GOAL #4   Title Stand 45 minutes with pain not > 3/10.   Period Weeks   Status Achieved               Problem List Patient Active Problem List   Diagnosis Date Noted  . Obese 08/19/2013  . Hypertension   . Colon polyp   . Allergy   . Vitamin D deficiency   . Osteoarthritis   . Erectile dysfunction   . Hyperlipidemia    PHYSICAL THERAPY DISCHARGE SUMMARY  Visits from Start of Care:   Current functional level related to goals / functional outcomes: Please see above.   Remaining deficits: Ankle strength deficit remains.   Education / Equipment: HEP. Plan: Patient agrees to discharge.  Patient goals were partially met. Patient is being discharged due to meeting the stated rehab goals.  ?????       Darek Eifler, [name] 06/29/2015,  1:09 PM  [facility] [address] [place], [place], [address] Phone: [phone]   Fax:  [phone]

## 2015-07-25 ENCOUNTER — Other Ambulatory Visit: Payer: Self-pay

## 2015-07-25 DIAGNOSIS — J302 Other seasonal allergic rhinitis: Secondary | ICD-10-CM

## 2015-07-25 MED ORDER — LEVOCETIRIZINE DIHYDROCHLORIDE 5 MG PO TABS: ORAL_TABLET | ORAL | Status: DC

## 2015-08-31 ENCOUNTER — Other Ambulatory Visit: Payer: Self-pay | Admitting: Family Medicine

## 2015-08-31 NOTE — Telephone Encounter (Signed)
Have him use 2000 units daily over-the-counter instead.

## 2015-09-13 ENCOUNTER — Ambulatory Visit (INDEPENDENT_AMBULATORY_CARE_PROVIDER_SITE_OTHER): Payer: BLUE CROSS/BLUE SHIELD | Admitting: *Deleted

## 2015-09-13 DIAGNOSIS — Z23 Encounter for immunization: Secondary | ICD-10-CM | POA: Diagnosis not present

## 2015-09-22 ENCOUNTER — Other Ambulatory Visit: Payer: Self-pay | Admitting: Family Medicine

## 2015-09-27 ENCOUNTER — Other Ambulatory Visit: Payer: Self-pay | Admitting: *Deleted

## 2015-09-27 MED ORDER — LEVOCETIRIZINE DIHYDROCHLORIDE 5 MG PO TABS: 5.0000 mg | ORAL_TABLET | Freq: Every evening | ORAL | Status: DC

## 2015-10-01 ENCOUNTER — Other Ambulatory Visit: Payer: Self-pay | Admitting: Family Medicine

## 2015-10-04 ENCOUNTER — Encounter: Payer: Self-pay | Admitting: Nurse Practitioner

## 2015-10-04 ENCOUNTER — Ambulatory Visit (INDEPENDENT_AMBULATORY_CARE_PROVIDER_SITE_OTHER): Payer: BLUE CROSS/BLUE SHIELD | Admitting: Nurse Practitioner

## 2015-10-04 VITALS — BP 136/88 | HR 50 | Temp 98.8°F | Ht 71.0 in | Wt 256.0 lb

## 2015-10-04 DIAGNOSIS — M1990 Unspecified osteoarthritis, unspecified site: Secondary | ICD-10-CM

## 2015-10-04 DIAGNOSIS — E669 Obesity, unspecified: Secondary | ICD-10-CM | POA: Diagnosis not present

## 2015-10-04 DIAGNOSIS — I10 Essential (primary) hypertension: Secondary | ICD-10-CM | POA: Diagnosis not present

## 2015-10-04 DIAGNOSIS — N528 Other male erectile dysfunction: Secondary | ICD-10-CM

## 2015-10-04 DIAGNOSIS — N529 Male erectile dysfunction, unspecified: Secondary | ICD-10-CM | POA: Diagnosis not present

## 2015-10-04 DIAGNOSIS — Z125 Encounter for screening for malignant neoplasm of prostate: Secondary | ICD-10-CM | POA: Diagnosis not present

## 2015-10-04 DIAGNOSIS — Z1159 Encounter for screening for other viral diseases: Secondary | ICD-10-CM | POA: Diagnosis not present

## 2015-10-04 DIAGNOSIS — Z1212 Encounter for screening for malignant neoplasm of rectum: Secondary | ICD-10-CM

## 2015-10-04 DIAGNOSIS — E785 Hyperlipidemia, unspecified: Secondary | ICD-10-CM

## 2015-10-04 MED ORDER — METHYLPREDNISOLONE ACETATE 80 MG/ML IJ SUSP
80.0000 mg | Freq: Once | INTRAMUSCULAR | Status: AC
  Administered 2015-10-04: 80 mg via INTRAMUSCULAR

## 2015-10-04 MED ORDER — DICLOFENAC SODIUM 75 MG PO TBEC: DELAYED_RELEASE_TABLET | ORAL | Status: DC

## 2015-10-04 MED ORDER — SILDENAFIL CITRATE 20 MG PO TABS: 20.0000 mg | ORAL_TABLET | Freq: Three times a day (TID) | ORAL | Status: DC

## 2015-10-04 NOTE — Progress Notes (Signed)
 Subjective:    Patient ID: Gregory Evans, male    DOB: 07/19/1956, 59 y.o.   MRN: [id number]   Patient here today for follow up of chronic medical problems.He has not been seen in awhile. Denies any changes since last visit.  * Only complaint is pain in right leg- has been coming on for quite some time- was in diclofenac in the past which worked well  Hypertension This is a chronic problem. The current episode started more than 1 year ago. The problem has been waxing and waning since onset. The problem is resistant. Risk factors for coronary artery disease include male gender and obesity. Past treatments include beta blockers and calcium channel blockers. The current treatment provides moderate improvement. Compliance problems include exercise and diet.  There is no history of CAD/MI.  Hyperlipidemia This is a chronic problem. The current episode started more than 1 year ago. The problem is uncontrolled. Exacerbating diseases include obesity. He has no history of diabetes or hypothyroidism. He is currently on no antihyperlipidemic treatment (currently not taking anything). The current treatment provides moderate improvement of lipids. Compliance problems include adherence to diet and adherence to exercise.  Risk factors for coronary artery disease include hypertension, dyslipidemia, obesity and male sex.  erectile dysfunction sidenafil works well when he takes it. He has been out of meds for awhile. Osteoarthritis Multiple joints with frequent flare ups. Has taken diclofenac in the past it really helps.      Review of Systems  Constitutional: Negative.   HENT: Negative.   Respiratory: Negative.   Cardiovascular: Negative.   Genitourinary: Negative.   Musculoskeletal: Negative.   Neurological: Negative.   Psychiatric/Behavioral: Negative.   All other systems reviewed and are negative.      Objective:   Physical Exam  Constitutional: He is oriented to person, place, and time. He  appears well-developed and well-nourished.  HENT:  Head: Normocephalic.  Right Ear: External ear normal.  Left Ear: External ear normal.  Nose: Nose normal.  Mouth/Throat: Oropharynx is clear and moist.  Eyes: EOM are normal. Pupils are equal, round, and reactive to light.  Neck: Normal range of motion. Neck supple. No JVD present. No thyromegaly present.  Cardiovascular: Normal rate, regular rhythm, normal heart sounds and intact distal pulses.  Exam reveals no gallop and no friction rub.   No murmur heard. Pulmonary/Chest: Effort normal and breath sounds normal. No respiratory distress. He has no wheezes. He has no rales. He exhibits no tenderness.  Abdominal: Soft. Bowel sounds are normal. He exhibits no mass. There is no tenderness.  Genitourinary: Prostate normal and penis normal.  Musculoskeletal: Normal range of motion. He exhibits no edema.  Pain in right calf with sitting and standing.  Lymphadenopathy:    He has no cervical adenopathy.  Neurological: He is alert and oriented to person, place, and time. No cranial nerve deficit.  Skin: Skin is warm and dry.  Psychiatric: He has a normal mood and affect. His behavior is normal. Judgment and thought content normal.    BP 136/88 mmHg  Pulse 50  Temp(Src) 98.8 F (37.1 C) (Oral)  Ht 5' 11" (1.803 m)  Wt 256 lb (116.121 kg)  BMI 35.72 kg/m2        Assessment & Plan:  1. Essential hypertension Do not add salt to diet - CMP14+EGFR  2. Hyperlipidemia Low fta diet - Lipid panel  3. Obese Discussed diet and exercise for person with BMI >25 Will recheck weight in 3-6 months    4. Other male erectile dysfunction  5. Osteoarthritis, unspecified osteoarthritis type, unspecified site Rest mosit heat to leg - diclofenac (VOLTAREN) 75 MG EC tablet; TAKE 1 TABLET (75 MG TOTAL) BY MOUTH 2 (TWO) TIMES DAILY.  Dispense: 60 tablet; Refill: 0 - methylPREDNISolone acetate (DEPO-MEDROL) injection 80 mg; Inject 1 mL (80 mg total)  into the muscle once.  6. Erectile dysfunction, unspecified erectile dysfunction type - sildenafil (REVATIO) 20 MG tablet; Take 1 tablet (20 mg total) by mouth 3 (three) times daily.  Dispense: 90 tablet; Refill: 2  7. Prostate cancer screening - PSA, total and free  8. Screening for malignant neoplasm of the rectum - Fecal occult blood, imunochemical; Future  9. Need for hepatitis C screening test - Hepatitis C antibody    Labs pending Health maintenance reviewed Diet and exercise encouraged Continue all meds Follow up  In 6 month   Mary-Margaret Martin, FNP    

## 2015-10-04 NOTE — Patient Instructions (Signed)

## 2015-10-05 LAB — CMP14+EGFR
ALT: 27 IU/L (ref 0–44)
AST: 25 IU/L (ref 0–40)
Albumin/Globulin Ratio: 1.7 (ref 1.1–2.5)
Albumin: 4.5 g/dL (ref 3.6–4.8)
Alkaline Phosphatase: 39 IU/L (ref 39–117)
BUN/Creatinine Ratio: 16 (ref 10–22)
BUN: 18 mg/dL (ref 8–27)
Bilirubin Total: 0.5 mg/dL (ref 0.0–1.2)
CO2: 25 mmol/L (ref 18–29)
Calcium: 9.6 mg/dL (ref 8.6–10.2)
Chloride: 100 mmol/L (ref 97–106)
Creatinine, Ser: 1.13 mg/dL (ref 0.76–1.27)
GFR calc Af Amer: 81 mL/min/{1.73_m2} (ref >59)
GFR calc non Af Amer: 70 mL/min/{1.73_m2} (ref >59)
Globulin, Total: 2.6 g/dL (ref 1.5–4.5)
Glucose: 91 mg/dL (ref 65–99)
Potassium: 4.6 mmol/L (ref 3.5–5.2)
Sodium: 143 mmol/L (ref 136–144)
Total Protein: 7.1 g/dL (ref 6.0–8.5)

## 2015-10-05 LAB — LIPID PANEL
Chol/HDL Ratio: 3.4 ratio units (ref 0.0–5.0)
Cholesterol, Total: 182 mg/dL (ref 100–199)
HDL: 53 mg/dL (ref >39)
LDL Calculated: 106 mg/dL — ABNORMAL HIGH (ref 0–99)
Triglycerides: 115 mg/dL (ref 0–149)
VLDL Cholesterol Cal: 23 mg/dL (ref 5–40)

## 2015-10-05 LAB — PSA, TOTAL AND FREE
PSA, Free Pct: 23.8 %
PSA, Free: 0.57 ng/mL
Prostate Specific Ag, Serum: 2.4 ng/mL (ref 0.0–4.0)

## 2015-10-05 LAB — HEPATITIS C ANTIBODY: Hep C Virus Ab: <0.1 s/co ratio (ref 0.0–0.9)

## 2015-10-08 ENCOUNTER — Telehealth: Payer: Self-pay | Admitting: Family

## 2015-10-09 NOTE — Telephone Encounter (Signed)
Left detailed message per pt request advising of lab results.

## 2015-12-26 ENCOUNTER — Other Ambulatory Visit: Payer: Self-pay | Admitting: Nurse Practitioner

## 2016-01-22 ENCOUNTER — Other Ambulatory Visit: Payer: Self-pay | Admitting: Nurse Practitioner

## 2016-03-17 ENCOUNTER — Other Ambulatory Visit: Payer: Self-pay | Admitting: Family Medicine

## 2016-03-19 ENCOUNTER — Encounter: Payer: Self-pay | Admitting: Family Medicine

## 2016-03-19 ENCOUNTER — Ambulatory Visit (INDEPENDENT_AMBULATORY_CARE_PROVIDER_SITE_OTHER): Payer: BLUE CROSS/BLUE SHIELD | Admitting: Family Medicine

## 2016-03-19 VITALS — BP 148/72 | HR 78 | Temp 98.1°F | Ht 71.0 in | Wt 265.4 lb

## 2016-03-19 DIAGNOSIS — I10 Essential (primary) hypertension: Secondary | ICD-10-CM

## 2016-03-19 DIAGNOSIS — T783XXA Angioneurotic edema, initial encounter: Secondary | ICD-10-CM | POA: Insufficient documentation

## 2016-03-19 MED ORDER — BETAMETHASONE SOD PHOS & ACET 6 (3-3) MG/ML IJ SUSP
6.0000 mg | Freq: Once | INTRAMUSCULAR | Status: AC
  Administered 2016-03-19: 6 mg via INTRAMUSCULAR

## 2016-03-19 MED ORDER — DICLOFENAC SODIUM 75 MG PO TBEC: DELAYED_RELEASE_TABLET | ORAL | Status: DC

## 2016-03-19 NOTE — Progress Notes (Signed)
Subjective:  Patient ID: Gregory Evans, male    DOB: Mar 10, 1956  Age: 60 y.o. MRN: [id number]  CC: Allergic Reaction   HPI Gregory Evans presents for Patient states he felt something in his throat as morning. he looked in the mirror and his uvula was swollen. It feels like a piece of meat is caught in there. However it is not affecting his breathing. He is able to swallow food as well. He reports that about 10 years ago he went to his doctor with similar symptoms. He was sent up to an ENT in the same building that day and was told that he had a reaction to his Cozaar. He no longer takes the medicine as of that date. However he continues to take multiple medicines as noted below. He was concerned they may be causing the swelling. None of them are relatively new. He is taking the Xyzal intermittently for pollen allergy. He's had a little bit of of stuffy runny nose but no  severe allergy symptoms   History Gregory Evans has a past medical history of Hypertension; Colon polyp (2008); Allergy; Vitamin D deficiency; Osteoarthritis; Erectile dysfunction; and Hyperlipidemia.   He has no past surgical history on file.   His family history includes Diabetes (age of onset: 14) in his father; Hypertension in his mother.He reports that he quit smoking about 22 years ago. He has never used smokeless tobacco. He reports that he drinks about 8.4 oz of alcohol per week. He reports that he does not use illicit drugs.    ROS Review of Systems  Constitutional: Negative for fever, chills and diaphoresis.  HENT: Negative for rhinorrhea and sore throat.   Respiratory: Negative for cough and shortness of breath.   Cardiovascular: Negative for chest pain.  Gastrointestinal: Negative for abdominal pain.  Musculoskeletal: Negative for myalgias and arthralgias.  Skin: Negative for rash.  Neurological: Negative for weakness and headaches.    Objective:  BP 148/72 mmHg  Pulse 78  Temp(Src) 98.1 F (36.7 C) (Oral)  Ht  _0  (1.803 m)  Wt 265 lb 6.4 oz (120.385 kg)  BMI 37.03 kg/m2  SpO2 98%  BP Readings from Last 3 Encounters:  03/19/16 148/72  10/04/15 136/88  03/19/15 120/65    Wt Readings from Last 3 Encounters:  03/19/16 265 lb 6.4 oz (120.385 kg)  10/04/15 256 lb (116.121 kg)  03/29/15 264 lb (119.75 kg)     Physical Exam  Constitutional: He appears well-developed and well-nourished.  HENT:  Head: Normocephalic and atraumatic.  Right Ear: Tympanic membrane and external ear normal. No decreased hearing is noted.  Left Ear: Tympanic membrane and external ear normal. No decreased hearing is noted.  Mouth/Throat: Uvula is midline. No trismus in the jaw. Uvula swelling (2+ overall, but 3+ at the distal tip. Doesnot occlude airway. Moderate erythema) present. Posterior oropharyngeal erythema present. No oropharyngeal exudate.  Eyes: Pupils are equal, round, and reactive to light.  Neck: Normal range of motion. Neck supple.  Cardiovascular: Normal rate and regular rhythm.   No murmur heard. Pulmonary/Chest: Breath sounds normal. No respiratory distress.  Abdominal: Soft. Bowel sounds are normal. He exhibits no mass. There is no tenderness.  Vitals reviewed.    Lab Results  Component Value Date   WBC 6.0 03/19/2015   HGB 13.2* 03/19/2015   HCT 41.2* 03/19/2015   GLUCOSE 91 10/04/2015   CHOL 182 10/04/2015   TRIG 115 10/04/2015   HDL 53 10/04/2015   LDLCALC 106* 10/04/2015   ALT  27 10/04/2015   AST 25 10/04/2015   NA 143 10/04/2015   K 4.6 10/04/2015   CL 100 10/04/2015   CREATININE 1.13 10/04/2015   BUN 18 10/04/2015   CO2 25 10/04/2015   TSH 1.210 03/19/2015   PSA 2.1 05/10/2014    Mr Ankle Left  Wo Contrast  03/29/2015  CLINICAL DATA:  Left ankle pain in the region of the lateral malleolus. EXAM: MRI OF THE LEFT ANKLE WITHOUT CONTRAST TECHNIQUE: Multiplanar, multisequence MR imaging of the ankle was performed. No intravenous contrast was administered. COMPARISON:  None.  FINDINGS: TENDONS Peroneal: Intact.  Mild peroneal tenosynovitis. Posteromedial: Intact.  Mild tibialis posterior tenosynovitis. Anterior: Intact. Achilles: Intact.  Edema in Kager's fat. Plantar Fascia: Intact. LIGAMENTS Lateral: Intact. Medial: Intact. CARTILAGE Ankle Joint: Intact ankle mortise. 8 x 13 mm osteochondral lesion involving the medial corner of the talar dome with overlying cartilage loss and subchondral edema and a small amount of fluid undercutting the lesion. Mild partial-thickness cartilage loss with subchondral reactive marrow changes in the posterior medial tibial plafond. Subtalar Joints/Sinus Tarsi: Normal subtalar joints. Normal sinus tarsi. Bones: Mild osteoarthritis of the talonavicular joint. T2 hyperintense marrow changes in the mid and anterior calcaneus likely reflecting prominent vasculature. IMPRESSION: 1. Mild peroneal tenosynovitis. 2. Mild tibialis posterior tenosynovitis. 3. 8 x 13 mm osteochondral lesion involving the medial corner of the talar dome with overlying cartilage loss and subchondral edema. There is a small amount of fluid undercutting the lesion suggesting instability. Mild osteoarthritis of the tibiotalar joint. Electronically Signed   By: Kathreen Devoid   On: 03/29/2015 15:53    Assessment & Plan:   Gregory Evans was seen today for allergic reaction.  Diagnoses and all orders for this visit:  Angioedema, initial encounter -     C4 complement -     C1 Esterase Inhibitor -     CBC with Differential/Platelet -     CMP14+EGFR -     Sedimentation rate -     betamethasone acetate-betamethasone sodium phosphate (CELESTONE) injection 6 mg; Inject 1 mL (6 mg total) into the muscle once.  Essential hypertension  Other orders -     diclofenac (VOLTAREN) 75 MG EC tablet; TAKE 1 TABLET (75 MG TOTAL) BY MOUTH 2 (TWO) TIMES DAILY.    I am having Gregory Evans maintain his Cholecalciferol, amLODipine, Vitamin D (Ergocalciferol), levocetirizine, sildenafil, metoprolol, and  diclofenac. We administered betamethasone acetate-betamethasone sodium phosphate.  Meds ordered this encounter  Medications  . betamethasone acetate-betamethasone sodium phosphate (CELESTONE) injection 6 mg    Sig:   . diclofenac (VOLTAREN) 75 MG EC tablet    Sig: TAKE 1 TABLET (75 MG TOTAL) BY MOUTH 2 (TWO) TIMES DAILY.    Dispense:  60 tablet    Refill:  0     Follow-up: Return if symptoms worsen or fail to improve.  Claretta Fraise, M.D.

## 2016-03-20 LAB — CMP14+EGFR
ALT: 31 IU/L (ref 0–44)
AST: 22 IU/L (ref 0–40)
Albumin/Globulin Ratio: 1.8 (ref 1.2–2.2)
Albumin: 4.6 g/dL (ref 3.6–4.8)
Alkaline Phosphatase: 32 IU/L — ABNORMAL LOW (ref 39–117)
BUN/Creatinine Ratio: 22 (ref 10–24)
BUN: 17 mg/dL (ref 8–27)
Bilirubin Total: 0.5 mg/dL (ref 0.0–1.2)
CO2: 24 mmol/L (ref 18–29)
Calcium: 9.6 mg/dL (ref 8.6–10.2)
Chloride: 101 mmol/L (ref 96–106)
Creatinine, Ser: 0.76 mg/dL (ref 0.76–1.27)
GFR calc Af Amer: 114 mL/min/{1.73_m2} (ref >59)
GFR calc non Af Amer: 98 mL/min/{1.73_m2} (ref >59)
Globulin, Total: 2.5 g/dL (ref 1.5–4.5)
Glucose: 100 mg/dL — ABNORMAL HIGH (ref 65–99)
Potassium: 4 mmol/L (ref 3.5–5.2)
Sodium: 140 mmol/L (ref 134–144)
Total Protein: 7.1 g/dL (ref 6.0–8.5)

## 2016-03-20 LAB — CBC WITH DIFFERENTIAL/PLATELET
Basophils Absolute: 0 10*3/uL (ref 0.0–0.2)
Basos: 1 %
EOS (ABSOLUTE): 0.3 10*3/uL (ref 0.0–0.4)
Eos: 5 %
Hematocrit: 39.6 % (ref 37.5–51.0)
Hemoglobin: 12.9 g/dL (ref 12.6–17.7)
Immature Grans (Abs): 0 10*3/uL (ref 0.0–0.1)
Immature Granulocytes: 0 %
Lymphocytes Absolute: 1.6 10*3/uL (ref 0.7–3.1)
Lymphs: 35 %
MCH: 29.7 pg (ref 26.6–33.0)
MCHC: 32.6 g/dL (ref 31.5–35.7)
MCV: 91 fL (ref 79–97)
Monocytes Absolute: 0.5 10*3/uL (ref 0.1–0.9)
Monocytes: 11 %
Neutrophils Absolute: 2.3 10*3/uL (ref 1.4–7.0)
Neutrophils: 48 %
Platelets: 212 10*3/uL (ref 150–379)
RBC: 4.34 x10E6/uL (ref 4.14–5.80)
RDW: 12.1 % — ABNORMAL LOW (ref 12.3–15.4)
WBC: 4.7 10*3/uL (ref 3.4–10.8)

## 2016-03-20 LAB — C1 ESTERASE INHIBITOR: C1INH SerPl-mCnc: 33 mg/dL (ref 21–39)

## 2016-03-20 LAB — SEDIMENTATION RATE: Sed Rate: 10 mm/hr (ref 0–30)

## 2016-03-20 LAB — C4 COMPLEMENT: Complement C4, Serum: 35 mg/dL (ref 14–44)

## 2016-04-02 ENCOUNTER — Ambulatory Visit (INDEPENDENT_AMBULATORY_CARE_PROVIDER_SITE_OTHER): Payer: BLUE CROSS/BLUE SHIELD | Admitting: Family Medicine

## 2016-04-02 ENCOUNTER — Encounter: Payer: Self-pay | Admitting: Family Medicine

## 2016-04-02 ENCOUNTER — Ambulatory Visit: Payer: BLUE CROSS/BLUE SHIELD | Admitting: Family Medicine

## 2016-04-02 VITALS — BP 112/60 | HR 66 | Temp 97.8°F | Ht 71.0 in | Wt 262.4 lb

## 2016-04-02 DIAGNOSIS — N529 Male erectile dysfunction, unspecified: Secondary | ICD-10-CM

## 2016-04-02 DIAGNOSIS — E559 Vitamin D deficiency, unspecified: Secondary | ICD-10-CM | POA: Diagnosis not present

## 2016-04-02 DIAGNOSIS — E785 Hyperlipidemia, unspecified: Secondary | ICD-10-CM | POA: Diagnosis not present

## 2016-04-02 DIAGNOSIS — Z0000 Encounter for general adult medical examination without abnormal findings: Secondary | ICD-10-CM

## 2016-04-02 DIAGNOSIS — E669 Obesity, unspecified: Secondary | ICD-10-CM | POA: Diagnosis not present

## 2016-04-02 DIAGNOSIS — Z1211 Encounter for screening for malignant neoplasm of colon: Secondary | ICD-10-CM

## 2016-04-02 DIAGNOSIS — I10 Essential (primary) hypertension: Secondary | ICD-10-CM | POA: Diagnosis not present

## 2016-04-02 DIAGNOSIS — R011 Cardiac murmur, unspecified: Secondary | ICD-10-CM

## 2016-04-02 DIAGNOSIS — IMO0001 Reserved for inherently not codable concepts without codable children: Secondary | ICD-10-CM

## 2016-04-02 MED ORDER — SILDENAFIL CITRATE 20 MG PO TABS: 20.0000 mg | ORAL_TABLET | Freq: Three times a day (TID) | ORAL | Status: DC

## 2016-04-02 NOTE — Progress Notes (Signed)
Subjective:  Patient ID: Gregory Evans, male    DOB: 1956-10-14  Age: 60 y.o. MRN: [id number]  CC: Annual Exam   HPI Gregory Evans presents for wellness. The swelling in the throat was better within a day and gone after 4.   follow-up of hypertension. Patient has no history of headache chest pain or shortness of breath or recent cough. Patient also denies symptoms of TIA such as numbness weakness lateralizing. Patient checks  blood pressure at home and has not had any elevated readings recently. Patient denies side effects from his medication. States taking it regularly.   Patient in for follow-up of elevated cholesterol. Doing well without complaints on current medication. Denies side effects of statin including myalgia and arthralgia and nausea. Also in today for liver function testing. Currently no chest pain, shortness of breath or other cardiovascular related symptoms noted.   History Gregory Evans has a past medical history of Hypertension; Colon polyp (2008); Allergy; Vitamin D deficiency; Osteoarthritis; Erectile dysfunction; and Hyperlipidemia.   He has no past surgical history on file.   His family history includes Diabetes (age of onset: 33) in his father; Hypertension in his mother.He reports that he quit smoking about 22 years ago. He has never used smokeless tobacco. He reports that he drinks about 8.4 oz of alcohol per week. He reports that he does not use illicit drugs.    ROS Review of Systems  Constitutional: Negative for fever, chills, diaphoresis, activity change, appetite change, fatigue and unexpected weight change.  HENT: Negative for congestion, ear pain, hearing loss, postnasal drip, rhinorrhea, sore throat, tinnitus and trouble swallowing.   Eyes: Negative for photophobia, pain, discharge and redness.  Respiratory: Negative for apnea, cough, choking, chest tightness, shortness of breath, wheezing and stridor.   Cardiovascular: Negative for chest pain, palpitations and  leg swelling.  Gastrointestinal: Negative for nausea, vomiting, abdominal pain, diarrhea, constipation, blood in stool and abdominal distention.  Endocrine: Negative for cold intolerance, heat intolerance, polydipsia, polyphagia and polyuria.  Genitourinary: Negative for dysuria, urgency, frequency, hematuria, flank pain, enuresis, difficulty urinating and genital sores.       Erectile dysfunction under control with sildenafil  Musculoskeletal: Positive for arthralgias (shoulder pain intermittently under good control with diclofenac). Negative for myalgias and joint swelling.  Skin: Negative for color change, rash and wound.       Multiple skin tags around the neck, one underneath the right eyelid. There is a flat lesion at the right upper of concern to patient.  Allergic/Immunologic: Negative for immunocompromised state.  Neurological: Negative for dizziness, tremors, seizures, syncope, facial asymmetry, speech difficulty, weakness, light-headedness, numbness and headaches.  Hematological: Does not bruise/bleed easily.  Psychiatric/Behavioral: Negative for suicidal ideas, hallucinations, behavioral problems, confusion, sleep disturbance, dysphoric mood, decreased concentration and agitation. The patient is not nervous/anxious and is not hyperactive.     Objective:  BP 112/60 mmHg  Pulse 66  Temp(Src) 97.8 F (36.6 C) (Oral)  Ht _0  (1.803 m)  Wt 262 lb 6.4 oz (119.024 kg)  BMI 36.61 kg/m2  SpO2 97%  BP Readings from Last 3 Encounters:  04/02/16 112/60  03/19/16 148/72  10/04/15 136/88    Wt Readings from Last 3 Encounters:  04/02/16 262 lb 6.4 oz (119.024 kg)  03/19/16 265 lb 6.4 oz (120.385 kg)  10/04/15 256 lb (116.121 kg)     Physical Exam  Constitutional: He is oriented to person, place, and time. He appears well-developed and well-nourished.  HENT:  Head: Normocephalic and atraumatic.  Right Ear: Tympanic membrane and external ear normal. No decreased hearing is  noted.  Left Ear: Tympanic membrane and external ear normal. No decreased hearing is noted.  Mouth/Throat: Uvula is midline. No trismus in the jaw. Uvula swelling (2+ overall, but 3+ at the distal tip. Doesnot occlude airway. Moderate erythema) present. Posterior oropharyngeal erythema present. No oropharyngeal exudate.  Eyes: EOM are normal. Pupils are equal, round, and reactive to light.  Neck: Normal range of motion. Neck supple. No tracheal deviation present. No thyromegaly present.  Cardiovascular: Normal rate, regular rhythm, S1 normal and S2 normal.  Exam reveals no gallop, no S3, no S4 and no friction rub.   Murmur heard.  Crescendo decrescendo systolic murmur is present with a grade of 3/6  Pulmonary/Chest: Breath sounds normal. No respiratory distress. He has no wheezes. He has no rales.  Abdominal: Soft. Bowel sounds are normal. He exhibits no mass. There is no tenderness.  Musculoskeletal: Normal range of motion. He exhibits no edema.  Neurological: He is alert and oriented to person, place, and time.  Skin: Skin is warm and dry.  Psychiatric: He has a normal mood and affect.  Vitals reviewed.    Lab Results  Component Value Date   WBC 4.7 03/19/2016   HGB 13.2* 03/19/2015   HCT 39.6 03/19/2016   PLT 212 03/19/2016   GLUCOSE 100* 03/19/2016   CHOL 182 10/04/2015   TRIG 115 10/04/2015   HDL 53 10/04/2015   LDLCALC 106* 10/04/2015   ALT 31 03/19/2016   AST 22 03/19/2016   NA 140 03/19/2016   K 4.0 03/19/2016   CL 101 03/19/2016   CREATININE 0.76 03/19/2016   BUN 17 03/19/2016   CO2 24 03/19/2016   TSH 1.210 03/19/2015   PSA 2.1 05/10/2014     Assessment & Plan:   Gregory Evans was seen today for annual exam.  Diagnoses and all orders for this visit:  Well adult -     CBC with Differential/Platelet -     CMP14+EGFR  Erectile dysfunction, unspecified erectile dysfunction type -     sildenafil (REVATIO) 20 MG tablet; Take 1 tablet (20 mg total) by mouth 3 (three)  times daily. -     CBC with Differential/Platelet -     CMP14+EGFR  Hyperlipidemia -     CBC with Differential/Platelet -     CMP14+EGFR -     Lipid panel  Essential hypertension -     CBC with Differential/Platelet -     CMP14+EGFR  Obese -     CBC with Differential/Platelet -     CMP14+EGFR  Vitamin D deficiency -     VITAMIN D 25 Hydroxy (Vit-D Deficiency, Fractures)  Undiagnosed cardiac murmurs -     ECHOCARDIOGRAM COMPLETE; Future    I have discontinued Mr. Duford Cholecalciferol and Vitamin D (Ergocalciferol). I am also having him maintain his amLODipine, levocetirizine, metoprolol, diclofenac, and sildenafil.  Meds ordered this encounter  Medications  . sildenafil (REVATIO) 20 MG tablet    Sig: Take 1 tablet (20 mg total) by mouth 3 (three) times daily.    Dispense:  100 tablet    Refill:  5    Reduce waist to less than 40 inches at the umbilicus.   Follow-up: Return in about 6 months (around 10/03/2016).  Claretta Fraise, M.D.

## 2016-04-02 NOTE — Addendum Note (Signed)
Addended by: Marin Olp on: 04/02/2016 05:20 PM   Modules accepted: Orders, SmartSet

## 2016-04-03 LAB — CMP14+EGFR
ALT: 41 IU/L (ref 0–44)
AST: 29 IU/L (ref 0–40)
Albumin/Globulin Ratio: 1.9 (ref 1.2–2.2)
Albumin: 4.5 g/dL (ref 3.6–4.8)
Alkaline Phosphatase: 34 IU/L — ABNORMAL LOW (ref 39–117)
BUN/Creatinine Ratio: 20 (ref 10–24)
BUN: 18 mg/dL (ref 8–27)
Bilirubin Total: 0.6 mg/dL (ref 0.0–1.2)
CO2: 25 mmol/L (ref 18–29)
Calcium: 9.8 mg/dL (ref 8.6–10.2)
Chloride: 100 mmol/L (ref 96–106)
Creatinine, Ser: 0.92 mg/dL (ref 0.76–1.27)
GFR calc Af Amer: 103 mL/min/{1.73_m2} (ref >59)
GFR calc non Af Amer: 89 mL/min/{1.73_m2} (ref >59)
Globulin, Total: 2.4 g/dL (ref 1.5–4.5)
Glucose: 86 mg/dL (ref 65–99)
Potassium: 4.1 mmol/L (ref 3.5–5.2)
Sodium: 142 mmol/L (ref 134–144)
Total Protein: 6.9 g/dL (ref 6.0–8.5)

## 2016-04-03 LAB — CBC WITH DIFFERENTIAL/PLATELET
Basophils Absolute: 0 10*3/uL (ref 0.0–0.2)
Basos: 1 %
EOS (ABSOLUTE): 0.3 10*3/uL (ref 0.0–0.4)
Eos: 6 %
Hematocrit: 38.8 % (ref 37.5–51.0)
Hemoglobin: 12.6 g/dL (ref 12.6–17.7)
Immature Grans (Abs): 0 10*3/uL (ref 0.0–0.1)
Immature Granulocytes: 0 %
Lymphocytes Absolute: 1.6 10*3/uL (ref 0.7–3.1)
Lymphs: 33 %
MCH: 29.9 pg (ref 26.6–33.0)
MCHC: 32.5 g/dL (ref 31.5–35.7)
MCV: 92 fL (ref 79–97)
Monocytes Absolute: 0.5 10*3/uL (ref 0.1–0.9)
Monocytes: 11 %
Neutrophils Absolute: 2.4 10*3/uL (ref 1.4–7.0)
Neutrophils: 49 %
Platelets: 210 10*3/uL (ref 150–379)
RBC: 4.21 x10E6/uL (ref 4.14–5.80)
RDW: 12.8 % (ref 12.3–15.4)
WBC: 4.9 10*3/uL (ref 3.4–10.8)

## 2016-04-03 LAB — VITAMIN D 25 HYDROXY (VIT D DEFICIENCY, FRACTURES): Vit D, 25-Hydroxy: 18.7 ng/mL — ABNORMAL LOW (ref 30.0–100.0)

## 2016-04-04 LAB — FECAL OCCULT BLOOD, IMMUNOCHEMICAL: Fecal Occult Bld: NEGATIVE

## 2016-04-13 ENCOUNTER — Other Ambulatory Visit: Payer: Self-pay | Admitting: Family Medicine

## 2016-04-14 ENCOUNTER — Other Ambulatory Visit: Payer: Self-pay | Admitting: Family

## 2016-06-09 ENCOUNTER — Other Ambulatory Visit: Payer: Self-pay | Admitting: Family Medicine

## 2016-07-05 ENCOUNTER — Other Ambulatory Visit: Payer: Self-pay | Admitting: Family Medicine

## 2016-07-07 ENCOUNTER — Other Ambulatory Visit: Payer: Self-pay | Admitting: Family Medicine

## 2016-08-05 ENCOUNTER — Other Ambulatory Visit: Payer: Self-pay

## 2016-08-05 ENCOUNTER — Other Ambulatory Visit: Payer: Self-pay | Admitting: *Deleted

## 2016-08-05 MED ORDER — AMLODIPINE BESYLATE 10 MG PO TABS: ORAL_TABLET | ORAL | 0 refills | Status: DC

## 2016-08-05 MED ORDER — METOPROLOL TARTRATE 100 MG PO TABS: ORAL_TABLET | ORAL | 0 refills | Status: DC

## 2016-08-05 MED ORDER — LEVOCETIRIZINE DIHYDROCHLORIDE 5 MG PO TABS: ORAL_TABLET | ORAL | 0 refills | Status: DC

## 2016-08-05 MED ORDER — DICLOFENAC SODIUM 75 MG PO TBEC: DELAYED_RELEASE_TABLET | ORAL | 0 refills | Status: DC

## 2016-08-25 ENCOUNTER — Encounter: Payer: Self-pay | Admitting: *Deleted

## 2016-09-11 ENCOUNTER — Ambulatory Visit (INDEPENDENT_AMBULATORY_CARE_PROVIDER_SITE_OTHER): Payer: BLUE CROSS/BLUE SHIELD | Admitting: *Deleted

## 2016-09-11 DIAGNOSIS — Z23 Encounter for immunization: Secondary | ICD-10-CM

## 2016-09-30 ENCOUNTER — Telehealth: Payer: Self-pay | Admitting: *Deleted

## 2016-09-30 NOTE — Telephone Encounter (Signed)
Left message for patient to call back to reschedule appt with Dr. Quinn Axe on 11/15 due to Dr. Livia Snellen being out of the office.

## 2016-10-01 ENCOUNTER — Ambulatory Visit: Payer: BLUE CROSS/BLUE SHIELD | Admitting: Family Medicine

## 2016-10-03 ENCOUNTER — Ambulatory Visit: Payer: BLUE CROSS/BLUE SHIELD | Admitting: Family Medicine

## 2016-10-07 ENCOUNTER — Ambulatory Visit: Payer: BLUE CROSS/BLUE SHIELD | Admitting: Family Medicine

## 2016-10-15 ENCOUNTER — Encounter: Payer: Self-pay | Admitting: Family Medicine

## 2016-10-15 ENCOUNTER — Ambulatory Visit (INDEPENDENT_AMBULATORY_CARE_PROVIDER_SITE_OTHER): Payer: BLUE CROSS/BLUE SHIELD | Admitting: Family Medicine

## 2016-10-15 VITALS — BP 142/78 | HR 41 | Temp 98.1°F | Ht 71.0 in | Wt 267.0 lb

## 2016-10-15 DIAGNOSIS — N528 Other male erectile dysfunction: Secondary | ICD-10-CM | POA: Diagnosis not present

## 2016-10-15 DIAGNOSIS — M1990 Unspecified osteoarthritis, unspecified site: Secondary | ICD-10-CM

## 2016-10-15 DIAGNOSIS — E559 Vitamin D deficiency, unspecified: Secondary | ICD-10-CM

## 2016-10-15 DIAGNOSIS — E782 Mixed hyperlipidemia: Secondary | ICD-10-CM | POA: Diagnosis not present

## 2016-10-15 DIAGNOSIS — I10 Essential (primary) hypertension: Secondary | ICD-10-CM

## 2016-10-15 MED ORDER — TRIAMTERENE-HCTZ 37.5-25 MG PO TABS: 1.0000 | ORAL_TABLET | Freq: Every day | ORAL | 3 refills | Status: DC

## 2016-10-15 NOTE — Progress Notes (Signed)
Subjective:  Patient ID: Gregory Evans, male    DOB: 30-Jan-1956  Age: 60 y.o. MRN: [id number]  CC: Follow up chronic medical problems   HPI Gregory Evans presents for  follow-up of hypertension. Patient has no history of headache chest pain or shortness of breath or recent cough. Patient also denies symptoms of TIA such as numbness weakness lateralizing. Patient checks  blood pressure at home and has not had any elevated readings recently. Patient denies side effects from his medication. States taking it regularly.  Patient also  in for follow-up of elevated cholesterol. Doing well without complaints on current medication. Denies side effects of statin including myalgia and arthralgia and nausea. Also in today for liver function testing. Currently no chest pain, shortness of breath or other cardiovascular related symptoms noted.  Arthritis affecting left knee and ankle   Erectile dysfunction improved with med   History Gregory Evans has a past medical history of Allergy; Colon polyp (2008); Erectile dysfunction; Hyperlipidemia; Hypertension; Osteoarthritis; and Vitamin D deficiency.   He has no past surgical history on file.   His family history includes Diabetes (age of onset: 10) in his father; Hypertension in his mother.He reports that he quit smoking about 23 years ago. He has never used smokeless tobacco. He reports that he drinks about 8.4 oz of alcohol per week . He reports that he does not use drugs.  Current Outpatient Prescriptions on File Prior to Visit  Medication Sig Dispense Refill  . amLODipine (NORVASC) 10 MG tablet TAKE 1 TABLET (10 MG TOTAL) BY MOUTH DAILY. 90 tablet 0  . diclofenac (VOLTAREN) 75 MG EC tablet TAKE 1 TABLET (75 MG TOTAL) BY MOUTH 2 (TWO) TIMES DAILY. 180 tablet 0  . levocetirizine (XYZAL) 5 MG tablet TAKE 1 TABLET EVERY DAY MUST BE SEEN FOR NEXT REFILL 90 tablet 0  . metoprolol (LOPRESSOR) 100 MG tablet TAKE 1 TABLET (100 MG TOTAL) BY MOUTH 2 (TWO) TIMES DAILY. 180  tablet 0  . sildenafil (REVATIO) 20 MG tablet Take 1 tablet (20 mg total) by mouth 3 (three) times daily. 100 tablet 5  . [DISCONTINUED] nebivolol (BYSTOLIC) 10 MG tablet Take 2 tablets (20 mg total) by mouth daily. 60 tablet 0   No current facility-administered medications on file prior to visit.     ROS Review of Systems  Constitutional: Negative for chills, diaphoresis, fever and unexpected weight change.  HENT: Negative for congestion, hearing loss, rhinorrhea and sore throat.   Eyes: Negative for visual disturbance.  Respiratory: Negative for cough and shortness of breath.   Cardiovascular: Negative for chest pain.  Gastrointestinal: Negative for abdominal pain, constipation and diarrhea.  Genitourinary: Negative for dysuria and flank pain.  Musculoskeletal: Positive for arthralgias. Negative for joint swelling.  Skin: Negative for rash.  Neurological: Negative for dizziness and headaches.  Psychiatric/Behavioral: Negative for dysphoric mood and sleep disturbance.    Objective:  BP (!) 142/78   Pulse (!) 41   Temp 98.1 F (36.7 C) (Oral)   Ht 5\' 11"  (1.803 m)   Wt 267 lb (121.1 kg)   BMI 37.24 kg/m   BP Readings from Last 3 Encounters:  10/15/16 (!) 142/78  04/02/16 112/60  03/19/16 (!) 148/72    Wt Readings from Last 3 Encounters:  10/15/16 267 lb (121.1 kg)  04/02/16 262 lb 6.4 oz (119 kg)  03/19/16 265 lb 6.4 oz (120.4 kg)     Physical Exam  Constitutional: He is oriented to person, place, and time. He appears well-developed  and well-nourished. No distress.  HENT:  Head: Normocephalic and atraumatic.  Right Ear: External ear normal.  Left Ear: External ear normal.  Nose: Nose normal.  Mouth/Throat: Oropharynx is clear and moist.  Eyes: Conjunctivae and EOM are normal. Pupils are equal, round, and reactive to light.  Neck: Normal range of motion. Neck supple. No thyromegaly present.  Cardiovascular: Normal rate, regular rhythm and normal heart sounds.     No murmur heard. Pulmonary/Chest: Effort normal and breath sounds normal. No respiratory distress. He has no wheezes. He has no rales.  Abdominal: Soft. Bowel sounds are normal. He exhibits no distension. There is no tenderness.  Lymphadenopathy:    He has no cervical adenopathy.  Neurological: He is alert and oriented to person, place, and time. He has normal reflexes.  Skin: Skin is warm and dry.  Psychiatric: He has a normal mood and affect. His behavior is normal. Judgment and thought content normal.    No results found for: HGBA1C  Lab Results  Component Value Date   WBC 4.9 04/02/2016   HGB 13.2 (A) 03/19/2015   HCT 38.8 04/02/2016   PLT 210 04/02/2016   GLUCOSE 86 04/02/2016   CHOL 182 10/04/2015   TRIG 115 10/04/2015   HDL 53 10/04/2015   LDLCALC 106 (H) 10/04/2015   ALT 41 04/02/2016   AST 29 04/02/2016   NA 142 04/02/2016   K 4.1 04/02/2016   CL 100 04/02/2016   CREATININE 0.92 04/02/2016   BUN 18 04/02/2016   CO2 25 04/02/2016   TSH 1.210 03/19/2015   PSA 2.1 05/10/2014    Mr Ankle Left  Wo Contrast  Result Date: 03/29/2015 CLINICAL DATA:  Left ankle pain in the region of the lateral malleolus. EXAM: MRI OF THE LEFT ANKLE WITHOUT CONTRAST TECHNIQUE: Multiplanar, multisequence MR imaging of the ankle was performed. No intravenous contrast was administered. COMPARISON:  None. FINDINGS: TENDONS Peroneal: Intact.  Mild peroneal tenosynovitis. Posteromedial: Intact.  Mild tibialis posterior tenosynovitis. Anterior: Intact. Achilles: Intact.  Edema in Kager's fat. Plantar Fascia: Intact. LIGAMENTS Lateral: Intact. Medial: Intact. CARTILAGE Ankle Joint: Intact ankle mortise. 8 x 13 mm osteochondral lesion involving the medial corner of the talar dome with overlying cartilage loss and subchondral edema and a small amount of fluid undercutting the lesion. Mild partial-thickness cartilage loss with subchondral reactive marrow changes in the posterior medial tibial plafond.  Subtalar Joints/Sinus Tarsi: Normal subtalar joints. Normal sinus tarsi. Bones: Mild osteoarthritis of the talonavicular joint. T2 hyperintense marrow changes in the mid and anterior calcaneus likely reflecting prominent vasculature. IMPRESSION: 1. Mild peroneal tenosynovitis. 2. Mild tibialis posterior tenosynovitis. 3. 8 x 13 mm osteochondral lesion involving the medial corner of the talar dome with overlying cartilage loss and subchondral edema. There is a small amount of fluid undercutting the lesion suggesting instability. Mild osteoarthritis of the tibiotalar joint. Electronically Signed   By: Kathreen Devoid   On: 03/29/2015 15:53    Assessment & Plan:   Williams was seen today for follow up chronic medical problems.  Diagnoses and all orders for this visit:  Mixed hyperlipidemia  Essential hypertension  Other male erectile dysfunction  Osteoarthritis, unspecified osteoarthritis type, unspecified site   I am having Mr. Kyle maintain his sildenafil, levocetirizine, diclofenac, metoprolol, and amLODipine.  No orders of the defined types were placed in this encounter.    Follow-up: No Follow-up on file.  Claretta Fraise, M.D.

## 2016-10-16 LAB — CMP14+EGFR
ALT: 32 IU/L (ref 0–44)
AST: 22 IU/L (ref 0–40)
Albumin/Globulin Ratio: 1.6 (ref 1.2–2.2)
Albumin: 4.3 g/dL (ref 3.6–4.8)
Alkaline Phosphatase: 32 IU/L — ABNORMAL LOW (ref 39–117)
BUN/Creatinine Ratio: 15 (ref 10–24)
BUN: 16 mg/dL (ref 8–27)
Bilirubin Total: 0.6 mg/dL (ref 0.0–1.2)
CO2: 26 mmol/L (ref 18–29)
Calcium: 9.7 mg/dL (ref 8.6–10.2)
Chloride: 101 mmol/L (ref 96–106)
Creatinine, Ser: 1.04 mg/dL (ref 0.76–1.27)
GFR calc Af Amer: 89 mL/min/{1.73_m2} (ref >59)
GFR calc non Af Amer: 77 mL/min/{1.73_m2} (ref >59)
Globulin, Total: 2.7 g/dL (ref 1.5–4.5)
Glucose: 96 mg/dL (ref 65–99)
Potassium: 4.6 mmol/L (ref 3.5–5.2)
Sodium: 141 mmol/L (ref 134–144)
Total Protein: 7 g/dL (ref 6.0–8.5)

## 2016-10-16 LAB — CBC WITH DIFFERENTIAL/PLATELET
Basophils Absolute: 0 10*3/uL (ref 0.0–0.2)
Basos: 1 %
EOS (ABSOLUTE): 0.5 10*3/uL — ABNORMAL HIGH (ref 0.0–0.4)
Eos: 10 %
Hematocrit: 37.7 % (ref 37.5–51.0)
Hemoglobin: 12.3 g/dL — ABNORMAL LOW (ref 12.6–17.7)
Immature Grans (Abs): 0 10*3/uL (ref 0.0–0.1)
Immature Granulocytes: 0 %
Lymphocytes Absolute: 2.2 10*3/uL (ref 0.7–3.1)
Lymphs: 40 %
MCH: 29.1 pg (ref 26.6–33.0)
MCHC: 32.6 g/dL (ref 31.5–35.7)
MCV: 89 fL (ref 79–97)
Monocytes Absolute: 0.6 10*3/uL (ref 0.1–0.9)
Monocytes: 12 %
Neutrophils Absolute: 2 10*3/uL (ref 1.4–7.0)
Neutrophils: 37 %
Platelets: 205 10*3/uL (ref 150–379)
RBC: 4.22 x10E6/uL (ref 4.14–5.80)
RDW: 13.1 % (ref 12.3–15.4)
WBC: 5.4 10*3/uL (ref 3.4–10.8)

## 2016-10-16 LAB — VITAMIN D 25 HYDROXY (VIT D DEFICIENCY, FRACTURES): Vit D, 25-Hydroxy: 32.8 ng/mL (ref 30.0–100.0)

## 2016-10-16 LAB — LIPID PANEL
Chol/HDL Ratio: 3.5 ratio units (ref 0.0–5.0)
Cholesterol, Total: 170 mg/dL (ref 100–199)
HDL: 48 mg/dL (ref >39)
LDL Calculated: 92 mg/dL (ref 0–99)
Triglycerides: 151 mg/dL — ABNORMAL HIGH (ref 0–149)
VLDL Cholesterol Cal: 30 mg/dL (ref 5–40)

## 2016-11-06 ENCOUNTER — Encounter: Payer: Self-pay | Admitting: *Deleted

## 2016-11-08 ENCOUNTER — Other Ambulatory Visit: Payer: Self-pay | Admitting: Family Medicine

## 2016-11-21 IMAGING — MR MR ANKLE*L* W/O CM
6 series · 40 of 40 positions shown · non-contrast
Comparison: None.

CLINICAL DATA: Left ankle pain in the region of the lateral
malleolus.

EXAM:
MRI OF THE LEFT ANKLE WITHOUT CONTRAST
TECHNIQUE: Multiplanar, multisequence MR imaging of the ankle was performed. No
intravenous contrast was administered.

[Series 3: pdfs axial · axial · 3.0mm · 0.48mm/px · z∈[-90,+39]mm · 7 of 37 slices shown]
[im 1/37]
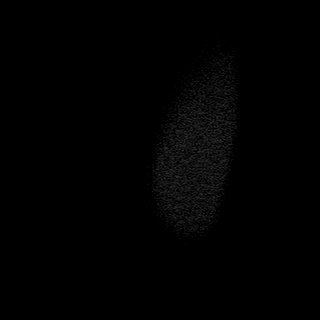
[im 7/37]
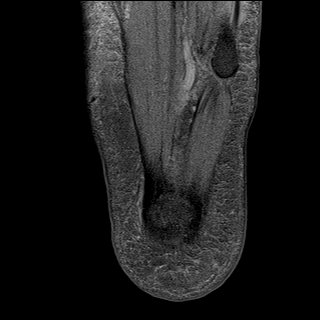
[im 13/37]
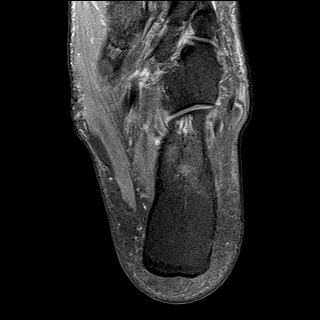
[im 19/37]
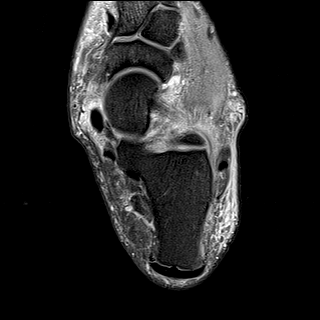
[im 25/37]
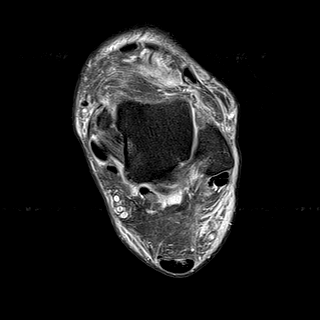
[im 31/37]
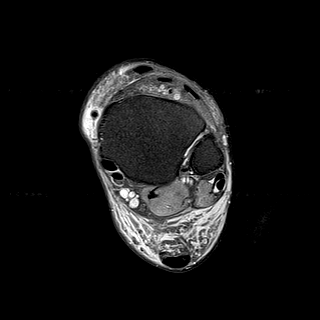
[im 37/37]
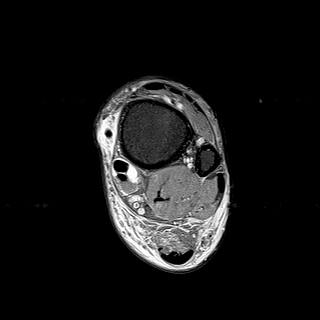

[Series 4: t2fs axial · axial · 3.0mm · 0.48mm/px · z∈[-90,+39]mm · 8 of 37 slices shown]
[im 1/37]
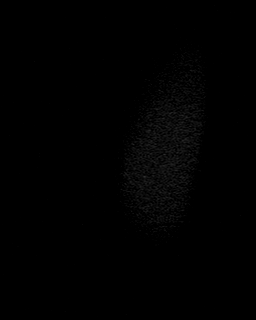
[im 6/37]
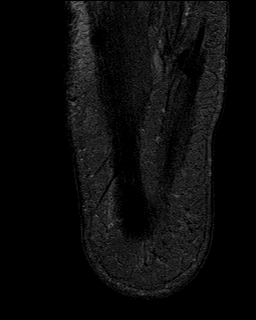
[im 11/37]
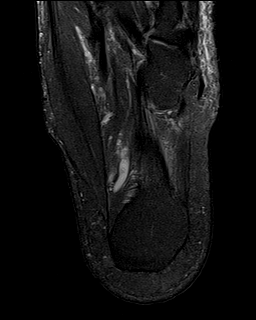
[im 16/37]
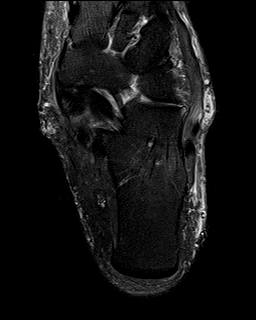
[im 21/37]
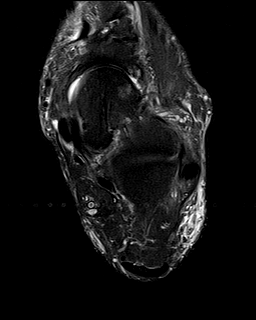
[im 26/37]
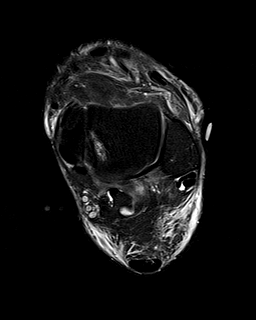
[im 31/37]
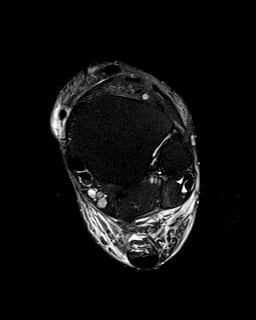
[im 37/37]
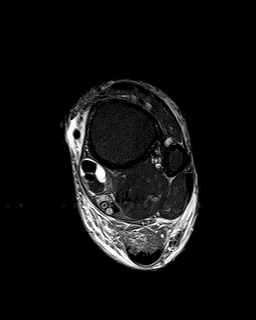

[Series 5: t2fs cor · coronal · 3.0mm · 0.47mm/px · 7 of 34 slices shown]
[im 1/34]
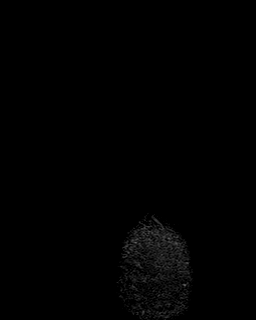
[im 6/34]
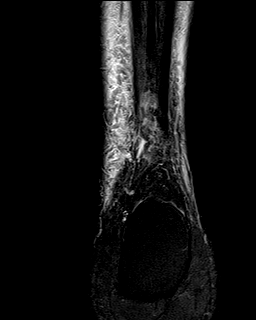
[im 12/34]
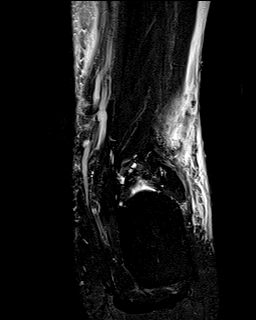
[im 17/34]
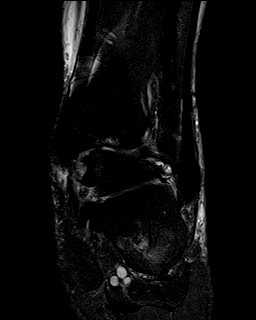
[im 23/34]
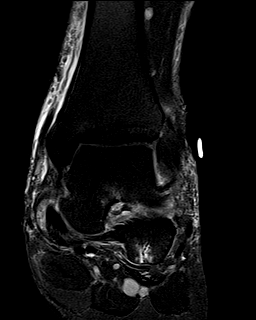
[im 28/34]
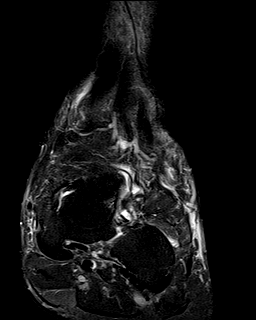
[im 34/34]
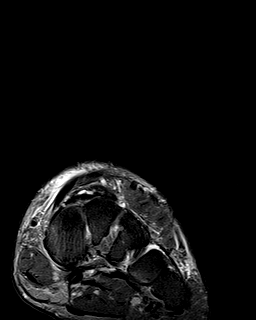

[Series 6: t2fs sag · sagittal · 3.0mm · 0.47mm/px · 6 of 27 slices shown (1 of 2)]
[im 1/27]
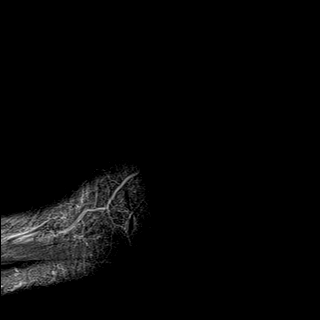
[im 6/27]
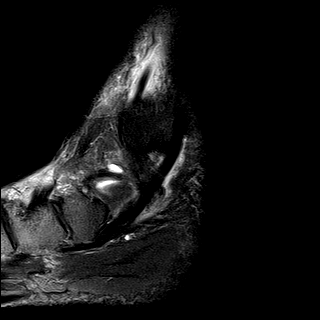
[im 11/27]
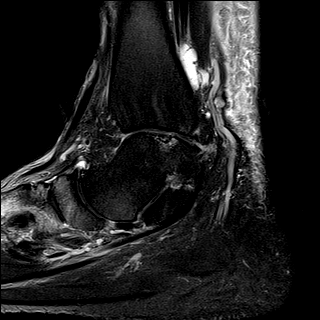
[im 16/27]
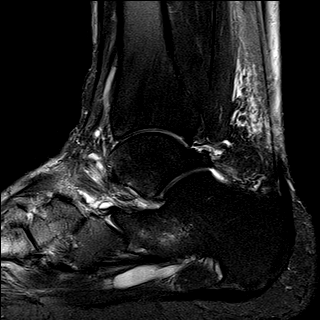
[im 21/27]
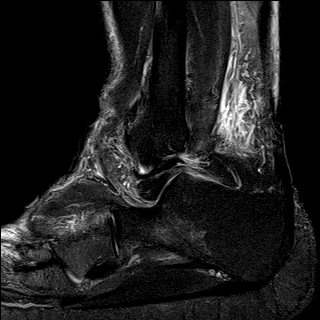
[im 27/27]
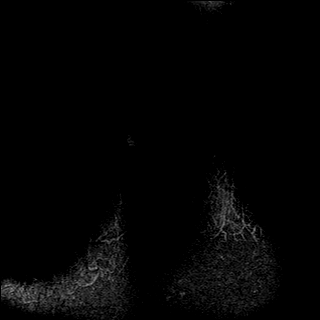

[Series 7: T1 · sagittal · 3.0mm · 0.39mm/px · 6 of 27 slices shown]
[im 1/27]
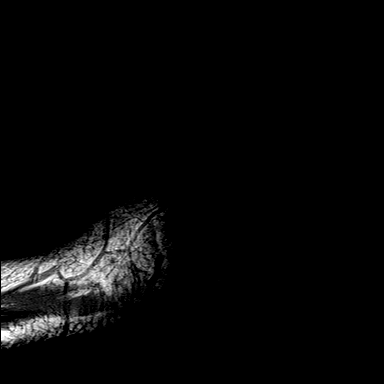
[im 6/27]
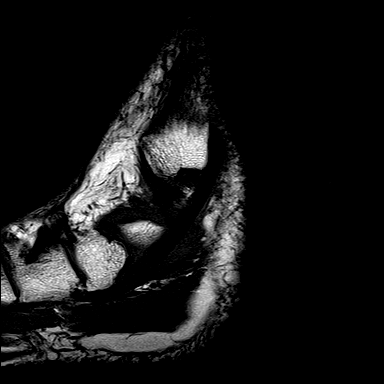
[im 11/27]
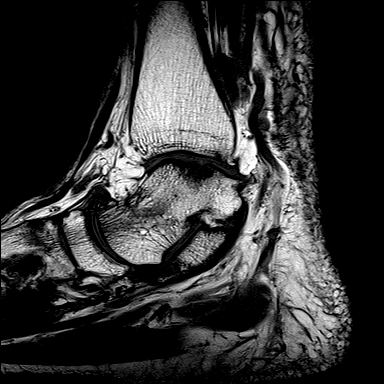
[im 16/27]
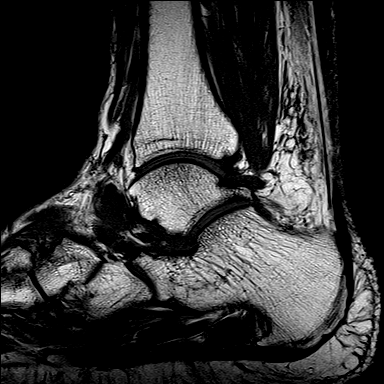
[im 21/27]
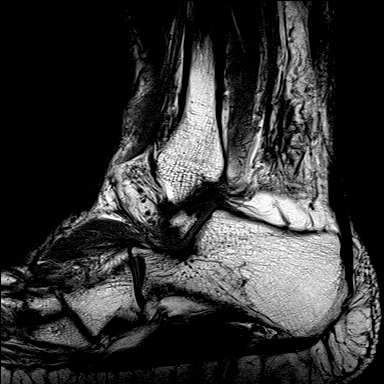
[im 27/27]
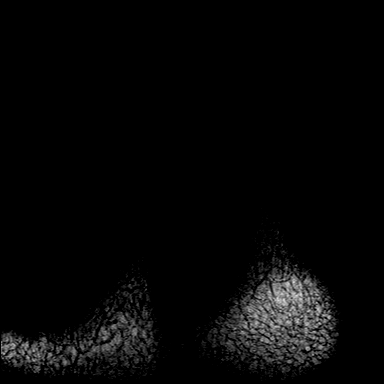

[Series 8: t2fs sag · sagittal · 3.0mm · 0.59mm/px · 6 of 27 slices shown (2 of 2)]
[im 1/27]
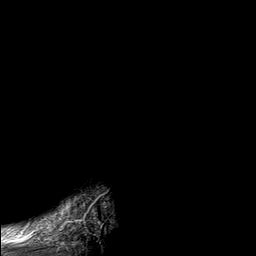
[im 6/27]
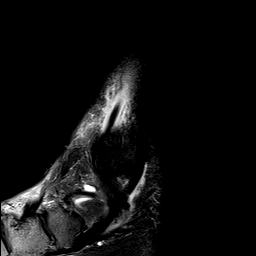
[im 11/27]
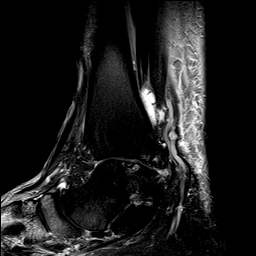
[im 16/27]
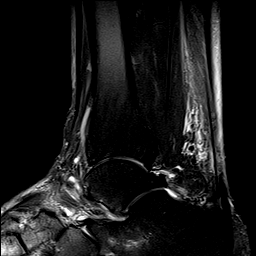
[im 21/27]
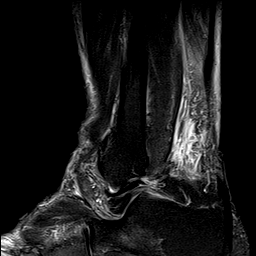
[im 27/27]
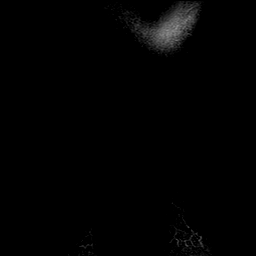

[40 of 40 positions shown; findings below may reference images not displayed]

FINDINGS: TENDONS

Peroneal: Intact.  Mild peroneal tenosynovitis.

Posteromedial: Intact.  Mild tibialis posterior tenosynovitis.

Anterior: Intact.

Achilles: Intact.  Edema in Kager's fat.

Plantar Fascia: Intact.

LIGAMENTS

Lateral: Intact.

Medial: Intact.

CARTILAGE

Ankle Joint: Intact ankle mortise. 8 x 13 mm osteochondral lesion
involving the medial corner of the talar dome with overlying
cartilage loss and subchondral edema and a small amount of fluid
undercutting the lesion. Mild partial-thickness cartilage loss with
subchondral reactive marrow changes in the posterior medial tibial
plafond.

Subtalar Joints/Sinus Tarsi: Normal subtalar joints. Normal sinus
tarsi.

Bones: Mild osteoarthritis of the talonavicular joint. T2
hyperintense marrow changes in the mid and anterior calcaneus likely
reflecting prominent vasculature.
IMPRESSION: 1. Mild peroneal tenosynovitis.
2. Mild tibialis posterior tenosynovitis.
3. 8 x 13 mm osteochondral lesion involving the medial corner of the
talar dome with overlying cartilage loss and subchondral edema.
There is a small amount of fluid undercutting the lesion suggesting
instability. Mild osteoarthritis of the tibiotalar joint.

## 2017-01-23 ENCOUNTER — Other Ambulatory Visit: Payer: Self-pay | Admitting: Family Medicine

## 2017-02-16 ENCOUNTER — Other Ambulatory Visit: Payer: Self-pay | Admitting: Family Medicine

## 2017-04-15 ENCOUNTER — Ambulatory Visit (INDEPENDENT_AMBULATORY_CARE_PROVIDER_SITE_OTHER): Payer: BLUE CROSS/BLUE SHIELD | Admitting: Family Medicine

## 2017-04-15 ENCOUNTER — Encounter: Payer: Self-pay | Admitting: Family Medicine

## 2017-04-15 VITALS — BP 136/80 | HR 41 | Temp 99.3°F | Ht 71.0 in | Wt 267.0 lb

## 2017-04-15 DIAGNOSIS — E782 Mixed hyperlipidemia: Secondary | ICD-10-CM

## 2017-04-15 DIAGNOSIS — Z125 Encounter for screening for malignant neoplasm of prostate: Secondary | ICD-10-CM

## 2017-04-15 DIAGNOSIS — N529 Male erectile dysfunction, unspecified: Secondary | ICD-10-CM

## 2017-04-15 DIAGNOSIS — M1990 Unspecified osteoarthritis, unspecified site: Secondary | ICD-10-CM | POA: Diagnosis not present

## 2017-04-15 DIAGNOSIS — I10 Essential (primary) hypertension: Secondary | ICD-10-CM

## 2017-04-15 DIAGNOSIS — M7631 Iliotibial band syndrome, right leg: Secondary | ICD-10-CM

## 2017-04-15 MED ORDER — TRIAMTERENE-HCTZ 37.5-25 MG PO TABS
1.0000 | ORAL_TABLET | Freq: Every day | ORAL | 3 refills | Status: DC
Start: 2017-04-15 — End: 2018-07-06

## 2017-04-15 MED ORDER — DICLOFENAC SODIUM 75 MG PO TBEC: 75.0000 mg | DELAYED_RELEASE_TABLET | Freq: Two times a day (BID) | ORAL | 1 refills | Status: DC

## 2017-04-15 MED ORDER — METOPROLOL TARTRATE 100 MG PO TABS: 100.0000 mg | ORAL_TABLET | Freq: Two times a day (BID) | ORAL | 3 refills | Status: DC

## 2017-04-15 MED ORDER — LEVOCETIRIZINE DIHYDROCHLORIDE 5 MG PO TABS: ORAL_TABLET | ORAL | 3 refills | Status: DC

## 2017-04-15 MED ORDER — AMLODIPINE BESYLATE 10 MG PO TABS: ORAL_TABLET | ORAL | 1 refills | Status: DC

## 2017-04-15 MED ORDER — PREDNISONE 10 MG PO TABS: ORAL_TABLET | ORAL | 0 refills | Status: DC

## 2017-04-15 NOTE — Progress Notes (Signed)
Subjective:  Patient ID: Gregory Evans, male    DOB: 1956/10/13  Age: 61 y.o. MRN: [id number]  CC: Hypertension (pt here today for routine follow up of his chronic medical conditions, no other concerns voiced.)   HPI Gregory Evans presents for  follow-up of hypertension. Patient has no history of headache chest pain or shortness of breath or recent cough. Patient also denies symptoms of TIA such as numbness weakness lateralizing. Patient checks  blood pressure at home and has not had any elevated readings recently. Patient denies side effects from medication. States taking it regularly. Patient in for follow-up of elevated cholesterol. Doing well without complaints on current medication. Denies side effects of statin including myalgia and arthralgia and nausea. Also in today for liver function testing. Currently no chest pain, shortness of breath or other cardiovascular related symptoms noted.  Patient relates that the right lateral thigh has a burning sensation that is quite intense coming daily. It lasts for about 3 minutes but seems to be getting a bit more intense and frequent with time. No known injury. Traces the margin of the IT band  History Job has a past medical history of Allergy; Colon polyp (2008); Erectile dysfunction; Hyperlipidemia; Hypertension; Osteoarthritis; and Vitamin D deficiency.   He has no past surgical history on file.   His family history includes Diabetes (age of onset: 68) in his father; Hypertension in his mother.He reports that he quit smoking about 23 years ago. He has never used smokeless tobacco. He reports that he drinks about 8.4 oz of alcohol per week . He reports that he does not use drugs.  Current Outpatient Prescriptions on File Prior to Visit  Medication Sig Dispense Refill  . sildenafil (REVATIO) 20 MG tablet Take 1 tablet (20 mg total) by mouth 3 (three) times daily. 100 tablet 5  . [DISCONTINUED] nebivolol (BYSTOLIC) 10 MG tablet Take 2 tablets (20  mg total) by mouth daily. 60 tablet 0   No current facility-administered medications on file prior to visit.     ROS Review of Systems  Constitutional: Negative for chills, diaphoresis, fever and unexpected weight change.  HENT: Negative for congestion, hearing loss, rhinorrhea and sore throat.   Eyes: Negative for visual disturbance.  Respiratory: Negative for cough and shortness of breath.   Cardiovascular: Negative for chest pain.  Gastrointestinal: Negative for abdominal pain, constipation and diarrhea.  Genitourinary: Negative for dysuria and flank pain.  Musculoskeletal: Positive for myalgias. Negative for arthralgias and joint swelling.  Skin: Negative for rash.  Neurological: Negative for dizziness and headaches.  Psychiatric/Behavioral: Negative for dysphoric mood and sleep disturbance.    Objective:  BP 136/80   Pulse (!) 41   Temp 99.3 F (37.4 C) (Oral)   Ht 5' 11" (1.803 m)   Wt 267 lb (121.1 kg)   BMI 37.24 kg/m   BP Readings from Last 3 Encounters:  04/15/17 136/80  10/15/16 (!) 142/78  04/02/16 112/60    Wt Readings from Last 3 Encounters:  04/15/17 267 lb (121.1 kg)  10/15/16 267 lb (121.1 kg)  04/02/16 262 lb 6.4 oz (119 kg)     Physical Exam  Constitutional: He is oriented to person, place, and time. He appears well-developed and well-nourished. No distress.  HENT:  Head: Normocephalic and atraumatic.  Right Ear: External ear normal.  Left Ear: External ear normal.  Nose: Nose normal.  Mouth/Throat: Oropharynx is clear and moist.  Eyes: Conjunctivae and EOM are normal. Pupils are equal, round, and reactive  to light.  Neck: Normal range of motion. Neck supple. No thyromegaly present.  Cardiovascular: Normal rate, regular rhythm and normal heart sounds.   No murmur heard. Pulmonary/Chest: Effort normal and breath sounds normal. No respiratory distress. He has no wheezes. He has no rales.  Abdominal: Soft. Bowel sounds are normal. He exhibits no  distension. There is no tenderness.  Lymphadenopathy:    He has no cervical adenopathy.  Neurological: He is alert and oriented to person, place, and time. He has normal reflexes.  Skin: Skin is warm and dry.  Psychiatric: He has a normal mood and affect. His behavior is normal. Judgment and thought content normal.     Lab Results  Component Value Date   WBC 5.4 10/15/2016   HGB 13.2 (A) 03/19/2015   HCT 37.7 10/15/2016   PLT 205 10/15/2016   GLUCOSE 96 10/15/2016   CHOL 170 10/15/2016   TRIG 151 (H) 10/15/2016   HDL 48 10/15/2016   LDLCALC 92 10/15/2016   ALT 32 10/15/2016   AST 22 10/15/2016   NA 141 10/15/2016   K 4.6 10/15/2016   CL 101 10/15/2016   CREATININE 1.04 10/15/2016   BUN 16 10/15/2016   CO2 26 10/15/2016   TSH 1.210 03/19/2015   PSA 2.1 05/10/2014    Mr Ankle Left  Wo Contrast  Result Date: 03/29/2015 CLINICAL DATA:  Left ankle pain in the region of the lateral malleolus. EXAM: MRI OF THE LEFT ANKLE WITHOUT CONTRAST TECHNIQUE: Multiplanar, multisequence MR imaging of the ankle was performed. No intravenous contrast was administered. COMPARISON:  None. FINDINGS: TENDONS Peroneal: Intact.  Mild peroneal tenosynovitis. Posteromedial: Intact.  Mild tibialis posterior tenosynovitis. Anterior: Intact. Achilles: Intact.  Edema in Kager's fat. Plantar Fascia: Intact. LIGAMENTS Lateral: Intact. Medial: Intact. CARTILAGE Ankle Joint: Intact ankle mortise. 8 x 13 mm osteochondral lesion involving the medial corner of the talar dome with overlying cartilage loss and subchondral edema and a small amount of fluid undercutting the lesion. Mild partial-thickness cartilage loss with subchondral reactive marrow changes in the posterior medial tibial plafond. Subtalar Joints/Sinus Tarsi: Normal subtalar joints. Normal sinus tarsi. Bones: Mild osteoarthritis of the talonavicular joint. T2 hyperintense marrow changes in the mid and anterior calcaneus likely reflecting prominent  vasculature. IMPRESSION: 1. Mild peroneal tenosynovitis. 2. Mild tibialis posterior tenosynovitis. 3. 8 x 13 mm osteochondral lesion involving the medial corner of the talar dome with overlying cartilage loss and subchondral edema. There is a small amount of fluid undercutting the lesion suggesting instability. Mild osteoarthritis of the tibiotalar joint. Electronically Signed   By: Hetal  Patel   On: 03/29/2015 15:53    Assessment & Plan:   Nazar was seen today for hypertension.  Diagnoses and all orders for this visit:  Essential hypertension -     amLODipine (NORVASC) 10 MG tablet; TAKE 1 TABLET (10 MG TOTAL) BY MOUTH DAILY. -     metoprolol tartrate (LOPRESSOR) 100 MG tablet; Take 1 tablet (100 mg total) by mouth 2 (two) times daily. -     triamterene-hydrochlorothiazide (MAXZIDE-25) 37.5-25 MG tablet; Take 1 tablet by mouth daily. -     CBC with Differential/Platelet -     CMP14+EGFR -     Lipid panel  Osteoarthritis, unspecified osteoarthritis type, unspecified site -     diclofenac (VOLTAREN) 75 MG EC tablet; Take 1 tablet (75 mg total) by mouth 2 (two) times daily. -     CBC with Differential/Platelet -     CMP14+EGFR -       Lipid panel  Mixed hyperlipidemia -     CMP14+EGFR -     Lipid panel  Erectile dysfunction, unspecified erectile dysfunction type  Screening for prostate cancer -     PSA Total (Reflex To Free)  It band syndrome, right -     predniSONE (DELTASONE) 10 MG tablet; Take 5 daily for 3 days followed by 4,3,2 and 1 for 3 days each.  Other orders -     levocetirizine (XYZAL) 5 MG tablet; TAKE 1 TABLET EVERY DAY MUST BE SEEN FOR NEXT REFILL   I have changed Gregory Evans's metoprolol tartrate. I am also having him start on predniSONE. Additionally, I am having him maintain his sildenafil, aspirin EC, Cholecalciferol (VITAMIN D3 PO), amLODipine, diclofenac, levocetirizine, and triamterene-hydrochlorothiazide.  Meds ordered this encounter  Medications  .  aspirin EC 81 MG tablet    Sig: Take 81 mg by mouth daily.  . Cholecalciferol (VITAMIN D3 PO)    Sig: Take by mouth.  . predniSONE (DELTASONE) 10 MG tablet    Sig: Take 5 daily for 3 days followed by 4,3,2 and 1 for 3 days each.    Dispense:  45 tablet    Refill:  0  . amLODipine (NORVASC) 10 MG tablet    Sig: TAKE 1 TABLET (10 MG TOTAL) BY MOUTH DAILY.    Dispense:  90 tablet    Refill:  1  . diclofenac (VOLTAREN) 75 MG EC tablet    Sig: Take 1 tablet (75 mg total) by mouth 2 (two) times daily.    Dispense:  180 tablet    Refill:  1  . levocetirizine (XYZAL) 5 MG tablet    Sig: TAKE 1 TABLET EVERY DAY MUST BE SEEN FOR NEXT REFILL    Dispense:  90 tablet    Refill:  3  . metoprolol tartrate (LOPRESSOR) 100 MG tablet    Sig: Take 1 tablet (100 mg total) by mouth 2 (two) times daily.    Dispense:  180 tablet    Refill:  3  . triamterene-hydrochlorothiazide (MAXZIDE-25) 37.5-25 MG tablet    Sig: Take 1 tablet by mouth daily.    Dispense:  90 tablet    Refill:  3      Follow-up: Return in about 6 months (around 10/16/2017) for Wellness, hypertension.  Warren Stacks, M.D. 

## 2017-04-16 LAB — CBC WITH DIFFERENTIAL/PLATELET
Basophils Absolute: 0 10*3/uL (ref 0.0–0.2)
Basos: 1 %
EOS (ABSOLUTE): 0.4 10*3/uL (ref 0.0–0.4)
Eos: 7 %
Hematocrit: 37.9 % (ref 37.5–51.0)
Hemoglobin: 12.6 g/dL — ABNORMAL LOW (ref 13.0–17.7)
Immature Grans (Abs): 0 10*3/uL (ref 0.0–0.1)
Immature Granulocytes: 0 %
Lymphocytes Absolute: 2.2 10*3/uL (ref 0.7–3.1)
Lymphs: 36 %
MCH: 30 pg (ref 26.6–33.0)
MCHC: 33.2 g/dL (ref 31.5–35.7)
MCV: 90 fL (ref 79–97)
Monocytes Absolute: 0.8 10*3/uL (ref 0.1–0.9)
Monocytes: 13 %
Neutrophils Absolute: 2.7 10*3/uL (ref 1.4–7.0)
Neutrophils: 43 %
Platelets: 195 10*3/uL (ref 150–379)
RBC: 4.2 x10E6/uL (ref 4.14–5.80)
RDW: 12.7 % (ref 12.3–15.4)
WBC: 6.2 10*3/uL (ref 3.4–10.8)

## 2017-04-16 LAB — CMP14+EGFR
ALT: 37 IU/L (ref 0–44)
AST: 22 IU/L (ref 0–40)
Albumin/Globulin Ratio: 1.7 (ref 1.2–2.2)
Albumin: 4.5 g/dL (ref 3.6–4.8)
Alkaline Phosphatase: 28 IU/L — ABNORMAL LOW (ref 39–117)
BUN/Creatinine Ratio: 18 (ref 10–24)
BUN: 28 mg/dL — ABNORMAL HIGH (ref 8–27)
Bilirubin Total: 0.6 mg/dL (ref 0.0–1.2)
CO2: 26 mmol/L (ref 18–29)
Calcium: 10 mg/dL (ref 8.6–10.2)
Chloride: 98 mmol/L (ref 96–106)
Creatinine, Ser: 1.55 mg/dL — ABNORMAL HIGH (ref 0.76–1.27)
GFR calc Af Amer: 54 mL/min/{1.73_m2} — ABNORMAL LOW (ref >59)
GFR calc non Af Amer: 47 mL/min/{1.73_m2} — ABNORMAL LOW (ref >59)
Globulin, Total: 2.7 g/dL (ref 1.5–4.5)
Glucose: 84 mg/dL (ref 65–99)
Potassium: 4.5 mmol/L (ref 3.5–5.2)
Sodium: 142 mmol/L (ref 134–144)
Total Protein: 7.2 g/dL (ref 6.0–8.5)

## 2017-04-16 LAB — LIPID PANEL
Chol/HDL Ratio: 3.6 ratio (ref 0.0–5.0)
Cholesterol, Total: 182 mg/dL (ref 100–199)
HDL: 50 mg/dL (ref >39)
LDL Calculated: 106 mg/dL — ABNORMAL HIGH (ref 0–99)
Triglycerides: 132 mg/dL (ref 0–149)
VLDL Cholesterol Cal: 26 mg/dL (ref 5–40)

## 2017-04-16 LAB — PSA TOTAL (REFLEX TO FREE): Prostate Specific Ag, Serum: 3.4 ng/mL (ref 0.0–4.0)

## 2017-04-20 ENCOUNTER — Other Ambulatory Visit: Payer: Self-pay | Admitting: Family Medicine

## 2017-05-05 ENCOUNTER — Other Ambulatory Visit: Payer: Self-pay | Admitting: Family Medicine

## 2017-05-05 DIAGNOSIS — M7631 Iliotibial band syndrome, right leg: Secondary | ICD-10-CM

## 2017-05-24 ENCOUNTER — Other Ambulatory Visit: Payer: Self-pay | Admitting: Family Medicine

## 2017-05-24 DIAGNOSIS — M7631 Iliotibial band syndrome, right leg: Secondary | ICD-10-CM

## 2017-05-25 NOTE — Telephone Encounter (Signed)
Needs appt to be seen. 

## 2017-05-25 NOTE — Telephone Encounter (Signed)
Last seen 04/15/17  Dr Stacks 

## 2017-05-26 NOTE — Telephone Encounter (Signed)
Left detailed message on pt VM that he ntbs prior to refills on requested rx refill and to call back with any further questions or concerns.

## 2017-09-03 DIAGNOSIS — Z23 Encounter for immunization: Secondary | ICD-10-CM | POA: Diagnosis not present

## 2017-10-16 ENCOUNTER — Encounter: Payer: Self-pay | Admitting: Family Medicine

## 2017-10-16 ENCOUNTER — Ambulatory Visit: Payer: BLUE CROSS/BLUE SHIELD | Admitting: Family Medicine

## 2017-10-16 VITALS — BP 133/75 | HR 45 | Temp 99.0°F | Ht 71.0 in | Wt 271.0 lb

## 2017-10-16 DIAGNOSIS — E782 Mixed hyperlipidemia: Secondary | ICD-10-CM

## 2017-10-16 DIAGNOSIS — K420 Umbilical hernia with obstruction, without gangrene: Secondary | ICD-10-CM

## 2017-10-16 DIAGNOSIS — I10 Essential (primary) hypertension: Secondary | ICD-10-CM | POA: Diagnosis not present

## 2017-10-16 DIAGNOSIS — E559 Vitamin D deficiency, unspecified: Secondary | ICD-10-CM

## 2017-10-16 MED ORDER — AMLODIPINE BESYLATE 10 MG PO TABS: ORAL_TABLET | ORAL | 3 refills | Status: DC

## 2017-10-16 NOTE — Progress Notes (Signed)
Subjective:  Patient ID: Gregory Evans,  male    DOB: [birth year]  Age: 61 y.o.    CC: Hypertension (6 mo) and Hyperlipidemia   HPI Gregory Evans presents for  follow-up of hypertension. Patient has no history of headache chest pain or shortness of breath or recent cough. Patient also denies symptoms of TIA such as numbness weakness lateralizing. Patient denies side effects from medication. States taking it regularly.  Patient also  in for follow-up of elevated cholesterol. Doing well without complaints on current medication. Denies side effects of statin including myalgia and arthralgia and nausea. Also in today for liver function testing. Currently no chest pain, shortness of breath or other cardiovascular related symptoms noted.   Patient has developed an umbilical hernia that evaluated as well.  It gives him no discomfort of any kind. History Gregory Evans has a past medical history of Allergy, Colon polyp (2008), Erectile dysfunction, Hyperlipidemia, Hypertension, Osteoarthritis, and Vitamin D deficiency.   He has no past surgical history on file.   His family history includes Diabetes (age of onset: 24) in his father; Hypertension in his mother.He reports that he quit smoking about 24 years ago. he has never used smokeless tobacco. He reports that he drinks about 8.4 oz of alcohol per week. He reports that he does not use drugs.  Current Outpatient Medications on File Prior to Visit  Medication Sig Dispense Refill  . aspirin EC 81 MG tablet Take 81 mg by mouth daily.    . Cholecalciferol (VITAMIN D3 PO) Take by mouth.    . diclofenac (VOLTAREN) 75 MG EC tablet Take 1 tablet (75 mg total) by mouth 2 (two) times daily. 180 tablet 1  . levocetirizine (XYZAL) 5 MG tablet TAKE 1 TABLET EVERY DAY MUST BE SEEN FOR NEXT REFILL 90 tablet 3  . metoprolol tartrate (LOPRESSOR) 100 MG tablet Take 1 tablet (100 mg total) by mouth 2 (two) times daily. 180 tablet 3  . triamterene-hydrochlorothiazide  (MAXZIDE-25) 37.5-25 MG tablet Take 1 tablet by mouth daily. 90 tablet 3  . sildenafil (REVATIO) 20 MG tablet Take 1 tablet (20 mg total) by mouth 3 (three) times daily. 100 tablet 5  . [DISCONTINUED] nebivolol (BYSTOLIC) 10 MG tablet Take 2 tablets (20 mg total) by mouth daily. 60 tablet 0   No current facility-administered medications on file prior to visit.     ROS Review of Systems  Constitutional: Negative for chills, diaphoresis, fever and unexpected weight change.  HENT: Negative for congestion, hearing loss, rhinorrhea and sore throat.   Eyes: Negative for visual disturbance.  Respiratory: Negative for cough and shortness of breath.   Cardiovascular: Negative for chest pain.  Gastrointestinal: Negative for abdominal pain, constipation and diarrhea.  Genitourinary: Negative for dysuria and flank pain.  Musculoskeletal: Negative for arthralgias and joint swelling.  Skin: Negative for rash.  Neurological: Negative for dizziness and headaches.  Psychiatric/Behavioral: Negative for dysphoric mood and sleep disturbance.    Objective:  BP 133/75 (BP Location: Left Arm)   Pulse (!) 45   Temp 99 F (37.2 C) (Oral)   Ht 5' 11"  (1.803 m)   Wt 271 lb (122.9 kg)   BMI 37.80 kg/m   BP Readings from Last 3 Encounters:  10/16/17 133/75  04/15/17 136/80  10/15/16 (!) 142/78    Wt Readings from Last 3 Encounters:  10/16/17 271 lb (122.9 kg)  04/15/17 267 lb (121.1 kg)  10/15/16 267 lb (121.1 kg)   Physical Exam  Constitutional: He is oriented  to person, place, and time. He appears well-developed and well-nourished. No distress.  HENT:  Head: Normocephalic and atraumatic.  Right Ear: External ear normal.  Left Ear: External ear normal.  Nose: Nose normal.  Mouth/Throat: Oropharynx is clear and moist.  Eyes: Conjunctivae and EOM are normal. Pupils are equal, round, and reactive to light.  Neck: Normal range of motion. Neck supple. No thyromegaly present.  Cardiovascular:  Normal rate, regular rhythm and normal heart sounds.  No murmur heard. Pulmonary/Chest: Effort normal and breath sounds normal. No respiratory distress. He has no wheezes. He has no rales.  Abdominal: Soft. Bowel sounds are normal. He exhibits no distension. There is no tenderness.  There is a 2 x 4 cm umbilical hernia that is easily reduced noted at the umbilicus  Lymphadenopathy:    He has no cervical adenopathy.  Neurological: He is alert and oriented to person, place, and time. He has normal reflexes.  Skin: Skin is warm and dry.  Psychiatric: He has a normal mood and affect. His behavior is normal. Judgment and thought content normal.      Assessment & Plan:   Gregory Evans was seen today for hypertension and hyperlipidemia.  Diagnoses and all orders for this visit:  Essential hypertension -     CBC with Differential/Platelet -     CMP14+EGFR  Mixed hyperlipidemia -     CBC with Differential/Platelet -     Lipid panel  Vitamin D deficiency -     CBC with Differential/Platelet  Umbilical hernia with obstruction, without gangrene  Other orders -     amLODipine (NORVASC) 10 MG tablet; TAKE 1 TABLET (10 MG TOTAL) BY MOUTH DAILY.   I have discontinued Gregory Evans's predniSONE. I am also having him maintain his sildenafil, aspirin EC, Cholecalciferol (VITAMIN D3 PO), diclofenac, levocetirizine, metoprolol tartrate, triamterene-hydrochlorothiazide, and amLODipine.  Meds ordered this encounter  Medications  . amLODipine (NORVASC) 10 MG tablet    Sig: TAKE 1 TABLET (10 MG TOTAL) BY MOUTH DAILY.    Dispense:  90 tablet    Refill:  3     Follow-up: Return in about 6 months (around 04/15/2018).  Claretta Fraise, M.D.

## 2017-10-16 NOTE — Patient Instructions (Signed)
Continue current medications. Continue good therapeutic lifestyle changes which include good diet and exercise. Fall precautions discussed with patient. If an FOBT was given today- please return it to our front desk. If you are over 63 years old - you may need Prevnar 13 or the adult Pneumonia vaccine.  **Flu shots are available--- please call and schedule a FLU-CLINIC appointment**  After your visit with us today you will receive a survey in the mail or online from [employer] regarding your care with us. Please take a moment to fill this out. Your feedback is very important to us as you can help us better understand your patient needs as well as improve your experience and satisfaction. WE CARE ABOUT YOU!!!    

## 2017-10-17 LAB — CMP14+EGFR
ALT: 34 IU/L (ref 0–44)
AST: 22 IU/L (ref 0–40)
Albumin/Globulin Ratio: 1.6 (ref 1.2–2.2)
Albumin: 4.5 g/dL (ref 3.6–4.8)
Alkaline Phosphatase: 31 IU/L — ABNORMAL LOW (ref 39–117)
BUN/Creatinine Ratio: 16 (ref 10–24)
BUN: 24 mg/dL (ref 8–27)
Bilirubin Total: 0.7 mg/dL (ref 0.0–1.2)
CO2: 24 mmol/L (ref 20–29)
Calcium: 9.6 mg/dL (ref 8.6–10.2)
Chloride: 98 mmol/L (ref 96–106)
Creatinine, Ser: 1.5 mg/dL — ABNORMAL HIGH (ref 0.76–1.27)
GFR calc Af Amer: 56 mL/min/{1.73_m2} — ABNORMAL LOW (ref >59)
GFR calc non Af Amer: 49 mL/min/{1.73_m2} — ABNORMAL LOW (ref >59)
Globulin, Total: 2.8 g/dL (ref 1.5–4.5)
Glucose: 86 mg/dL (ref 65–99)
Potassium: 4.7 mmol/L (ref 3.5–5.2)
Sodium: 140 mmol/L (ref 134–144)
Total Protein: 7.3 g/dL (ref 6.0–8.5)

## 2017-10-17 LAB — CBC WITH DIFFERENTIAL/PLATELET
Basophils Absolute: 0 10*3/uL (ref 0.0–0.2)
Basos: 1 %
EOS (ABSOLUTE): 0.4 10*3/uL (ref 0.0–0.4)
Eos: 6 %
Hematocrit: 37 % — ABNORMAL LOW (ref 37.5–51.0)
Hemoglobin: 12.3 g/dL — ABNORMAL LOW (ref 13.0–17.7)
Immature Grans (Abs): 0 10*3/uL (ref 0.0–0.1)
Immature Granulocytes: 0 %
Lymphocytes Absolute: 2.2 10*3/uL (ref 0.7–3.1)
Lymphs: 35 %
MCH: 29.6 pg (ref 26.6–33.0)
MCHC: 33.2 g/dL (ref 31.5–35.7)
MCV: 89 fL (ref 79–97)
Monocytes Absolute: 0.9 10*3/uL (ref 0.1–0.9)
Monocytes: 14 %
Neutrophils Absolute: 2.8 10*3/uL (ref 1.4–7.0)
Neutrophils: 44 %
Platelets: 210 10*3/uL (ref 150–379)
RBC: 4.15 x10E6/uL (ref 4.14–5.80)
RDW: 12.1 % — ABNORMAL LOW (ref 12.3–15.4)
WBC: 6.4 10*3/uL (ref 3.4–10.8)

## 2017-10-17 LAB — LIPID PANEL
Chol/HDL Ratio: 3.5 ratio (ref 0.0–5.0)
Cholesterol, Total: 179 mg/dL (ref 100–199)
HDL: 51 mg/dL (ref >39)
LDL Calculated: 104 mg/dL — ABNORMAL HIGH (ref 0–99)
Triglycerides: 121 mg/dL (ref 0–149)
VLDL Cholesterol Cal: 24 mg/dL (ref 5–40)

## 2017-11-16 ENCOUNTER — Other Ambulatory Visit: Payer: Self-pay | Admitting: Family Medicine

## 2017-11-16 DIAGNOSIS — M1990 Unspecified osteoarthritis, unspecified site: Secondary | ICD-10-CM

## 2018-02-20 ENCOUNTER — Other Ambulatory Visit: Payer: Self-pay | Admitting: Family Medicine

## 2018-02-20 DIAGNOSIS — M1990 Unspecified osteoarthritis, unspecified site: Secondary | ICD-10-CM

## 2018-04-03 ENCOUNTER — Other Ambulatory Visit: Payer: Self-pay | Admitting: Family Medicine

## 2018-04-06 ENCOUNTER — Other Ambulatory Visit: Payer: Self-pay | Admitting: Family Medicine

## 2018-04-06 DIAGNOSIS — I10 Essential (primary) hypertension: Secondary | ICD-10-CM

## 2018-05-22 ENCOUNTER — Other Ambulatory Visit: Payer: Self-pay | Admitting: Family Medicine

## 2018-05-22 DIAGNOSIS — M1990 Unspecified osteoarthritis, unspecified site: Secondary | ICD-10-CM

## 2018-05-24 NOTE — Telephone Encounter (Signed)
Last seen 10/16/17  Dr Livia Snellen

## 2018-07-01 ENCOUNTER — Other Ambulatory Visit: Payer: Self-pay | Admitting: Family Medicine

## 2018-07-01 NOTE — Telephone Encounter (Signed)
Last seen 10/16/17

## 2018-07-06 ENCOUNTER — Other Ambulatory Visit: Payer: Self-pay | Admitting: Family Medicine

## 2018-07-06 DIAGNOSIS — I10 Essential (primary) hypertension: Secondary | ICD-10-CM

## 2018-07-09 ENCOUNTER — Other Ambulatory Visit: Payer: Self-pay | Admitting: Family Medicine

## 2018-07-09 DIAGNOSIS — I10 Essential (primary) hypertension: Secondary | ICD-10-CM

## 2018-07-09 MED ORDER — METOPROLOL TARTRATE 100 MG PO TABS: 100.0000 mg | ORAL_TABLET | Freq: Two times a day (BID) | ORAL | 0 refills | Status: DC

## 2018-08-03 ENCOUNTER — Other Ambulatory Visit: Payer: Self-pay | Admitting: Family Medicine

## 2018-08-03 DIAGNOSIS — I10 Essential (primary) hypertension: Secondary | ICD-10-CM

## 2018-08-04 NOTE — Telephone Encounter (Signed)
Last seen 10/16/17  Dr Livia Snellen  Needs to be seen

## 2018-08-05 NOTE — Telephone Encounter (Signed)
Pt aware and scheduled appt with Dr Livia Snellen 08/10/18 at 4:25.

## 2018-08-10 ENCOUNTER — Encounter: Payer: Self-pay | Admitting: Family Medicine

## 2018-08-10 ENCOUNTER — Ambulatory Visit: Payer: BLUE CROSS/BLUE SHIELD | Admitting: Family Medicine

## 2018-08-10 VITALS — BP 113/71 | HR 48 | Temp 98.7°F | Ht 71.0 in | Wt 272.1 lb

## 2018-08-10 DIAGNOSIS — E782 Mixed hyperlipidemia: Secondary | ICD-10-CM | POA: Diagnosis not present

## 2018-08-10 DIAGNOSIS — N529 Male erectile dysfunction, unspecified: Secondary | ICD-10-CM

## 2018-08-10 DIAGNOSIS — Z125 Encounter for screening for malignant neoplasm of prostate: Secondary | ICD-10-CM | POA: Diagnosis not present

## 2018-08-10 DIAGNOSIS — I10 Essential (primary) hypertension: Secondary | ICD-10-CM

## 2018-08-10 DIAGNOSIS — M1990 Unspecified osteoarthritis, unspecified site: Secondary | ICD-10-CM

## 2018-08-10 MED ORDER — SILDENAFIL CITRATE 20 MG PO TABS: 20.0000 mg | ORAL_TABLET | Freq: Three times a day (TID) | ORAL | 5 refills | Status: DC

## 2018-08-10 MED ORDER — AMLODIPINE BESYLATE 10 MG PO TABS: ORAL_TABLET | ORAL | 3 refills | Status: DC

## 2018-08-10 MED ORDER — HYDROXYZINE PAMOATE 25 MG PO CAPS: 25.0000 mg | ORAL_CAPSULE | Freq: Two times a day (BID) | ORAL | 0 refills | Status: DC

## 2018-08-10 MED ORDER — PREDNISONE 10 MG PO TABS: ORAL_TABLET | ORAL | 0 refills | Status: DC

## 2018-08-10 MED ORDER — LEVOCETIRIZINE DIHYDROCHLORIDE 5 MG PO TABS: ORAL_TABLET | ORAL | 1 refills | Status: DC

## 2018-08-10 MED ORDER — DICLOFENAC SODIUM 75 MG PO TBEC: 75.0000 mg | DELAYED_RELEASE_TABLET | Freq: Two times a day (BID) | ORAL | 1 refills | Status: DC

## 2018-08-10 MED ORDER — TRIAMTERENE-HCTZ 37.5-25 MG PO TABS: 1.0000 | ORAL_TABLET | Freq: Every day | ORAL | 1 refills | Status: DC

## 2018-08-10 MED ORDER — METOPROLOL TARTRATE 100 MG PO TABS: 100.0000 mg | ORAL_TABLET | Freq: Two times a day (BID) | ORAL | 1 refills | Status: DC

## 2018-08-10 NOTE — Patient Instructions (Signed)

## 2018-08-11 ENCOUNTER — Other Ambulatory Visit: Payer: Self-pay | Admitting: Family Medicine

## 2018-08-11 ENCOUNTER — Encounter: Payer: Self-pay | Admitting: Family Medicine

## 2018-08-11 DIAGNOSIS — R972 Elevated prostate specific antigen [PSA]: Secondary | ICD-10-CM

## 2018-08-11 LAB — CMP14+EGFR
ALT: 27 IU/L (ref 0–44)
AST: 17 IU/L (ref 0–40)
Albumin/Globulin Ratio: 1.8 (ref 1.2–2.2)
Albumin: 4.5 g/dL (ref 3.6–4.8)
Alkaline Phosphatase: 31 IU/L — ABNORMAL LOW (ref 39–117)
BUN/Creatinine Ratio: 14 (ref 10–24)
BUN: 23 mg/dL (ref 8–27)
Bilirubin Total: 0.5 mg/dL (ref 0.0–1.2)
CO2: 23 mmol/L (ref 20–29)
Calcium: 9.9 mg/dL (ref 8.6–10.2)
Chloride: 101 mmol/L (ref 96–106)
Creatinine, Ser: 1.63 mg/dL — ABNORMAL HIGH (ref 0.76–1.27)
GFR calc Af Amer: 51 mL/min/{1.73_m2} — ABNORMAL LOW (ref >59)
GFR calc non Af Amer: 44 mL/min/{1.73_m2} — ABNORMAL LOW (ref >59)
Globulin, Total: 2.5 g/dL (ref 1.5–4.5)
Glucose: 104 mg/dL — ABNORMAL HIGH (ref 65–99)
Potassium: 4.4 mmol/L (ref 3.5–5.2)
Sodium: 143 mmol/L (ref 134–144)
Total Protein: 7 g/dL (ref 6.0–8.5)

## 2018-08-11 LAB — CBC WITH DIFFERENTIAL/PLATELET
Basophils Absolute: 0 10*3/uL (ref 0.0–0.2)
Basos: 1 %
EOS (ABSOLUTE): 0.5 10*3/uL — ABNORMAL HIGH (ref 0.0–0.4)
Eos: 9 %
Hematocrit: 35.4 % — ABNORMAL LOW (ref 37.5–51.0)
Hemoglobin: 11.6 g/dL — ABNORMAL LOW (ref 13.0–17.7)
Immature Grans (Abs): 0 10*3/uL (ref 0.0–0.1)
Immature Granulocytes: 0 %
Lymphocytes Absolute: 2 10*3/uL (ref 0.7–3.1)
Lymphs: 35 %
MCH: 30.1 pg (ref 26.6–33.0)
MCHC: 32.8 g/dL (ref 31.5–35.7)
MCV: 92 fL (ref 79–97)
Monocytes Absolute: 0.6 10*3/uL (ref 0.1–0.9)
Monocytes: 10 %
Neutrophils Absolute: 2.7 10*3/uL (ref 1.4–7.0)
Neutrophils: 45 %
Platelets: 210 10*3/uL (ref 150–450)
RBC: 3.86 x10E6/uL — ABNORMAL LOW (ref 4.14–5.80)
RDW: 12.4 % (ref 12.3–15.4)
WBC: 5.8 10*3/uL (ref 3.4–10.8)

## 2018-08-11 LAB — LIPID PANEL
Chol/HDL Ratio: 4.5 ratio (ref 0.0–5.0)
Cholesterol, Total: 198 mg/dL (ref 100–199)
HDL: 44 mg/dL (ref >39)
LDL Calculated: 112 mg/dL — ABNORMAL HIGH (ref 0–99)
Triglycerides: 209 mg/dL — ABNORMAL HIGH (ref 0–149)
VLDL Cholesterol Cal: 42 mg/dL — ABNORMAL HIGH (ref 5–40)

## 2018-08-11 LAB — PSA, TOTAL AND FREE
PSA, Free Pct: 29.2 %
PSA, Free: 1.4 ng/mL
Prostate Specific Ag, Serum: 4.8 ng/mL — ABNORMAL HIGH (ref 0.0–4.0)

## 2018-08-11 NOTE — Progress Notes (Signed)
Subjective:  Patient ID: Gregory Evans, male    DOB: 1956-06-11  Age: 62 y.o. MRN: [id number]  CC: Medical Management of Chronic Issues (pt here today for routine follow up of his chronic medical conditions and also c/o bilateral shoulder pain)   HPI Gregory Evans presents for  follow-up of hypertension. Patient has no history of headache chest pain or shortness of breath or recent cough. Patient also denies symptoms of TIA such as focal numbness or weakness. Patient denies side effects from medication. States taking it regularly.  Patient also having pain in both shoulders.  He is used injections before and the right one is hurting worse this time.  It is because he does a lot of work with his arms.  He demonstrates a rotation of the shoulder that he does repetitively through the day at work.  This is primarily on the right as well.  Patient also is noted to be overdue for health maintenance issues including colonoscopy and PSA.  He opts to have those performed/arranged today History Gregory Evans has a past medical history of Allergy, Colon polyp (2008), Erectile dysfunction, Hyperlipidemia, Hypertension, Osteoarthritis, and Vitamin D deficiency.   He has no past surgical history on file.   His family history includes Diabetes (age of onset: 28) in his father; Hypertension in his mother.He reports that he quit smoking about 24 years ago. He has never used smokeless tobacco. He reports that he drinks about 14.0 standard drinks of alcohol per week. He reports that he does not use drugs.  Current Outpatient Medications on File Prior to Visit  Medication Sig Dispense Refill  . aspirin EC 81 MG tablet Take 81 mg by mouth daily.    . Cholecalciferol (VITAMIN D3 PO) Take by mouth.    . [DISCONTINUED] nebivolol (BYSTOLIC) 10 MG tablet Take 2 tablets (20 mg total) by mouth daily. 60 tablet 0   No current facility-administered medications on file prior to visit.     ROS Review of Systems  Constitutional:  Negative.   HENT: Negative.   Eyes: Negative for visual disturbance.  Respiratory: Negative for cough and shortness of breath.   Cardiovascular: Negative for chest pain and leg swelling.  Gastrointestinal: Negative for abdominal pain, diarrhea, nausea and vomiting.  Genitourinary: Negative for difficulty urinating.  Musculoskeletal: Positive for arthralgias and myalgias.  Skin: Negative for rash.  Neurological: Negative for headaches.  Psychiatric/Behavioral: Negative for sleep disturbance.    Objective:  BP 113/71   Pulse (!) 48   Temp 98.7 F (37.1 C) (Oral)   Ht 5' 11"  (1.803 m)   Wt 272 lb 2 oz (123.4 kg)   BMI 37.95 kg/m   BP Readings from Last 3 Encounters:  08/10/18 113/71  10/16/17 133/75  04/15/17 136/80    Wt Readings from Last 3 Encounters:  08/10/18 272 lb 2 oz (123.4 kg)  10/16/17 271 lb (122.9 kg)  04/15/17 267 lb (121.1 kg)     Physical Exam  Constitutional: He is oriented to person, place, and time. He appears well-developed and well-nourished. No distress.  HENT:  Head: Normocephalic and atraumatic.  Right Ear: External ear normal.  Left Ear: External ear normal.  Nose: Nose normal.  Mouth/Throat: Oropharynx is clear and moist.  Eyes: Pupils are equal, round, and reactive to light. Conjunctivae and EOM are normal.  Neck: Normal range of motion. Neck supple.  Cardiovascular: Normal rate, regular rhythm and normal heart sounds.  No murmur heard. Pulmonary/Chest: Effort normal and breath sounds normal. No  respiratory distress. He has no wheezes. He has no rales.  Abdominal: Soft. There is no tenderness.  Musculoskeletal: Normal range of motion. He exhibits tenderness (At the ball of the right shoulder for deep palpation).  Neurological: He is alert and oriented to person, place, and time. He has normal reflexes.  Skin: Skin is warm and dry.  Psychiatric: He has a normal mood and affect. His behavior is normal. Judgment and thought content normal.       Assessment & Plan:   Gregory Evans was seen today for medical management of chronic issues.  Diagnoses and all orders for this visit:  Mixed hyperlipidemia -     CBC with Differential/Platelet -     CMP14+EGFR -     Lipid panel  Erectile dysfunction, unspecified erectile dysfunction type -     sildenafil (REVATIO) 20 MG tablet; Take 1 tablet (20 mg total) by mouth 3 (three) times daily.  Essential hypertension -     metoprolol tartrate (LOPRESSOR) 100 MG tablet; Take 1 tablet (100 mg total) by mouth 2 (two) times daily. -     triamterene-hydrochlorothiazide (MAXZIDE-25) 37.5-25 MG tablet; Take 1 tablet by mouth daily.  Osteoarthritis, unspecified osteoarthritis type, unspecified site -     diclofenac (VOLTAREN) 75 MG EC tablet; Take 1 tablet (75 mg total) by mouth 2 (two) times daily.  Screening for prostate cancer -     PSA, total and free  Other orders -     predniSONE (DELTASONE) 10 MG tablet; Take 5 PO x 3 days, then 4 PO x 3 days, then 3 PO x 3 days, then 2 PO x 3 days then 1 PO x 3 days. -     hydrOXYzine (VISTARIL) 25 MG capsule; Take 1 capsule (25 mg total) by mouth 2 (two) times daily. -     amLODipine (NORVASC) 10 MG tablet; TAKE 1 TABLET (10 MG TOTAL) BY MOUTH DAILY. -     levocetirizine (XYZAL) 5 MG tablet; TAKE 1 TABLET BY MOUTH EVERY DAY   Allergies as of 08/10/2018      Reactions   Ace Inhibitors    angioedema   Cozaar [losartan Potassium]       Medication List        Accurate as of 08/10/18 11:59 PM. Always use your most recent med list.          amLODipine 10 MG tablet Commonly known as:  NORVASC TAKE 1 TABLET (10 MG TOTAL) BY MOUTH DAILY.   aspirin EC 81 MG tablet Take 81 mg by mouth daily.   diclofenac 75 MG EC tablet Commonly known as:  VOLTAREN Take 1 tablet (75 mg total) by mouth 2 (two) times daily.   hydrOXYzine 25 MG capsule Commonly known as:  VISTARIL Take 1 capsule (25 mg total) by mouth 2 (two) times daily.   levocetirizine 5  MG tablet Commonly known as:  XYZAL TAKE 1 TABLET BY MOUTH EVERY DAY   metoprolol tartrate 100 MG tablet Commonly known as:  LOPRESSOR Take 1 tablet (100 mg total) by mouth 2 (two) times daily.   predniSONE 10 MG tablet Commonly known as:  DELTASONE Take 5 PO x 3 days, then 4 PO x 3 days, then 3 PO x 3 days, then 2 PO x 3 days then 1 PO x 3 days.   sildenafil 20 MG tablet Commonly known as:  REVATIO Take 1 tablet (20 mg total) by mouth 3 (three) times daily.   triamterene-hydrochlorothiazide 37.5-25 MG tablet Commonly  known as:  MAXZIDE-25 Take 1 tablet by mouth daily.   VITAMIN D3 PO Take by mouth.       Meds ordered this encounter  Medications  . predniSONE (DELTASONE) 10 MG tablet    Sig: Take 5 PO x 3 days, then 4 PO x 3 days, then 3 PO x 3 days, then 2 PO x 3 days then 1 PO x 3 days.    Dispense:  45 tablet    Refill:  0  . hydrOXYzine (VISTARIL) 25 MG capsule    Sig: Take 1 capsule (25 mg total) by mouth 2 (two) times daily.    Dispense:  60 capsule    Refill:  0  . sildenafil (REVATIO) 20 MG tablet    Sig: Take 1 tablet (20 mg total) by mouth 3 (three) times daily.    Dispense:  100 tablet    Refill:  5  . metoprolol tartrate (LOPRESSOR) 100 MG tablet    Sig: Take 1 tablet (100 mg total) by mouth 2 (two) times daily.    Dispense:  180 tablet    Refill:  1  . triamterene-hydrochlorothiazide (MAXZIDE-25) 37.5-25 MG tablet    Sig: Take 1 tablet by mouth daily.    Dispense:  90 tablet    Refill:  1  . diclofenac (VOLTAREN) 75 MG EC tablet    Sig: Take 1 tablet (75 mg total) by mouth 2 (two) times daily.    Dispense:  180 tablet    Refill:  1  . amLODipine (NORVASC) 10 MG tablet    Sig: TAKE 1 TABLET (10 MG TOTAL) BY MOUTH DAILY.    Dispense:  90 tablet    Refill:  3  . levocetirizine (XYZAL) 5 MG tablet    Sig: TAKE 1 TABLET BY MOUTH EVERY DAY    Dispense:  90 tablet    Refill:  1    Resting the shoulder recommended along with a prednisone  Dosepak.  Follow-up: Return in about 6 months (around 02/08/2019), or if symptoms worsen or fail to improve.  Claretta Fraise, M.D.

## 2018-09-05 ENCOUNTER — Other Ambulatory Visit: Payer: Self-pay | Admitting: Family Medicine

## 2018-09-08 DIAGNOSIS — Z23 Encounter for immunization: Secondary | ICD-10-CM | POA: Diagnosis not present

## 2018-12-10 ENCOUNTER — Other Ambulatory Visit: Payer: Self-pay | Admitting: Family Medicine

## 2018-12-10 NOTE — Telephone Encounter (Signed)
Last seen 08/10/18

## 2019-02-03 ENCOUNTER — Other Ambulatory Visit: Payer: Self-pay | Admitting: Family Medicine

## 2019-02-03 DIAGNOSIS — I10 Essential (primary) hypertension: Secondary | ICD-10-CM

## 2019-02-03 NOTE — Telephone Encounter (Signed)
Last seen 08/10/18

## 2019-02-08 ENCOUNTER — Other Ambulatory Visit: Payer: Self-pay

## 2019-02-08 ENCOUNTER — Ambulatory Visit (INDEPENDENT_AMBULATORY_CARE_PROVIDER_SITE_OTHER): Payer: BLUE CROSS/BLUE SHIELD | Admitting: Family Medicine

## 2019-02-08 ENCOUNTER — Encounter: Payer: Self-pay | Admitting: Family Medicine

## 2019-02-08 VITALS — BP 121/74 | HR 51 | Temp 99.1°F | Ht 71.0 in | Wt 255.1 lb

## 2019-02-08 DIAGNOSIS — E782 Mixed hyperlipidemia: Secondary | ICD-10-CM

## 2019-02-08 DIAGNOSIS — I10 Essential (primary) hypertension: Secondary | ICD-10-CM | POA: Diagnosis not present

## 2019-02-08 DIAGNOSIS — M1990 Unspecified osteoarthritis, unspecified site: Secondary | ICD-10-CM | POA: Diagnosis not present

## 2019-02-08 DIAGNOSIS — N529 Male erectile dysfunction, unspecified: Secondary | ICD-10-CM | POA: Diagnosis not present

## 2019-02-08 DIAGNOSIS — Z125 Encounter for screening for malignant neoplasm of prostate: Secondary | ICD-10-CM

## 2019-02-08 MED ORDER — PREDNISONE 10 MG PO TABS: ORAL_TABLET | ORAL | 0 refills | Status: DC

## 2019-02-08 MED ORDER — DICLOFENAC SODIUM 75 MG PO TBEC: 75.0000 mg | DELAYED_RELEASE_TABLET | Freq: Two times a day (BID) | ORAL | 1 refills | Status: DC

## 2019-02-08 MED ORDER — HYDROXYZINE PAMOATE 25 MG PO CAPS: 25.0000 mg | ORAL_CAPSULE | Freq: Two times a day (BID) | ORAL | 1 refills | Status: DC

## 2019-02-08 MED ORDER — METOPROLOL TARTRATE 100 MG PO TABS: 100.0000 mg | ORAL_TABLET | Freq: Two times a day (BID) | ORAL | 1 refills | Status: DC

## 2019-02-08 MED ORDER — TRIAMTERENE-HCTZ 37.5-25 MG PO TABS: 1.0000 | ORAL_TABLET | Freq: Every day | ORAL | 1 refills | Status: DC

## 2019-02-08 NOTE — Progress Notes (Signed)
Subjective:  Patient ID: Gregory Evans, male    DOB: Aug 28, 1956  Age: 63 y.o. MRN: [id number]  CC: Medical Management of Chronic Issues (pt here today for routine follow up of his chronic medical conditions and also c/o right shoulder pain)   HPI Gregory Evans presents for  follow-up of hypertension. Patient has no history of headache chest pain or shortness of breath or recent cough. Patient also denies symptoms of TIA such as focal numbness or weakness. Patient denies side effects from medication. States taking it regularly.Has lost 25 lb since last visit through low carb diet & DC alcohol. Feeling much better.  Right shoulder pain  flaring. No relief with diclofenac. Feels a popping with flexion &/or abduction. Pain is moderate. He is able to go about routine although painful at times. He saw ortho for this several years ago and had an injection.  History Gregory Evans has a past medical history of Allergy, Colon polyp (2008), Erectile dysfunction, Hyperlipidemia, Hypertension, Osteoarthritis, and Vitamin D deficiency.   He has no past surgical history on file.   His family history includes Diabetes (age of onset: 37) in his father; Hypertension in his mother.He reports that he quit smoking about 25 years ago. He has never used smokeless tobacco. He reports current alcohol use of about 14.0 standard drinks of alcohol per week. He reports that he does not use drugs.  Current Outpatient Medications on File Prior to Visit  Medication Sig Dispense Refill  . amLODipine (NORVASC) 10 MG tablet TAKE 1 TABLET (10 MG TOTAL) BY MOUTH DAILY. 90 tablet 3  . aspirin EC 81 MG tablet Take 81 mg by mouth daily.    . Cholecalciferol (VITAMIN D3 PO) Take by mouth.    . sildenafil (REVATIO) 20 MG tablet Take 1 tablet (20 mg total) by mouth 3 (three) times daily. 100 tablet 5  . [DISCONTINUED] nebivolol (BYSTOLIC) 10 MG tablet Take 2 tablets (20 mg total) by mouth daily. 60 tablet 0   No current facility-administered  medications on file prior to visit.     ROS Review of Systems  Constitutional: Negative.   HENT: Negative.   Eyes: Negative for visual disturbance.  Respiratory: Negative for cough and shortness of breath.   Cardiovascular: Negative for chest pain and leg swelling.  Gastrointestinal: Negative for abdominal pain, diarrhea, nausea and vomiting.  Genitourinary: Negative for difficulty urinating.  Musculoskeletal: Negative for arthralgias and myalgias.  Skin: Negative for rash.  Neurological: Negative for headaches.  Psychiatric/Behavioral: Negative for sleep disturbance.    Objective:  BP 121/74   Pulse (!) 51   Temp 99.1 F (37.3 C) (Oral)   Ht 5' 11"  (1.803 m)   Wt 255 lb 2 oz (115.7 kg)   BMI 35.58 kg/m   BP Readings from Last 3 Encounters:  02/08/19 121/74  08/10/18 113/71  10/16/17 133/75    Wt Readings from Last 3 Encounters:  02/08/19 255 lb 2 oz (115.7 kg)  08/10/18 272 lb 2 oz (123.4 kg)  10/16/17 271 lb (122.9 kg)     Physical Exam Constitutional:      General: He is not in acute distress.    Appearance: He is well-developed.  HENT:     Head: Normocephalic and atraumatic.     Right Ear: External ear normal.     Left Ear: External ear normal.     Nose: Nose normal.  Eyes:     Conjunctiva/sclera: Conjunctivae normal.     Pupils: Pupils are equal, round, and  reactive to light.  Neck:     Musculoskeletal: Normal range of motion and neck supple.  Cardiovascular:     Rate and Rhythm: Normal rate and regular rhythm.     Heart sounds: Normal heart sounds. No murmur.  Pulmonary:     Effort: Pulmonary effort is normal. No respiratory distress.     Breath sounds: Normal breath sounds. No wheezing or rales.  Abdominal:     Palpations: Abdomen is soft.     Tenderness: There is no abdominal tenderness.  Musculoskeletal: Normal range of motion.     Comments: Popping at posterior [facility] joint  Skin:    General: Skin is warm and dry.  Neurological:     Mental  Status: He is alert and oriented to person, place, and time.     Deep Tendon Reflexes: Reflexes are normal and symmetric.  Psychiatric:        Behavior: Behavior normal.        Thought Content: Thought content normal.        Judgment: Judgment normal.       Assessment & Plan:   Gregory Evans was seen today for medical management of chronic issues.  Diagnoses and all orders for this visit:  Mixed hyperlipidemia -     CBC with Differential/Platelet -     CMP14+EGFR -     Lipid panel  Essential hypertension -     metoprolol tartrate (LOPRESSOR) 100 MG tablet; Take 1 tablet (100 mg total) by mouth 2 (two) times daily. -     triamterene-hydrochlorothiazide (MAXZIDE-25) 37.5-25 MG tablet; Take 1 tablet by mouth daily.  Osteoarthritis, unspecified osteoarthritis type, unspecified site -     diclofenac (VOLTAREN) 75 MG EC tablet; Take 1 tablet (75 mg total) by mouth 2 (two) times daily.  Erectile dysfunction, unspecified erectile dysfunction type  Screening for prostate cancer -     PSA, total and free  Other orders -     hydrOXYzine (VISTARIL) 25 MG capsule; Take 1 capsule (25 mg total) by mouth 2 (two) times daily. -     predniSONE (DELTASONE) 10 MG tablet; Take 5 daily for 3 days followed by 4,3,2 and 1 for 3 days each.   Allergies as of 02/08/2019      Reactions   Ace Inhibitors    angioedema   Cozaar [losartan Potassium]       Medication List       Accurate as of February 08, 2019  5:08 PM. Always use your most recent med list.        amLODipine 10 MG tablet Commonly known as:  NORVASC TAKE 1 TABLET (10 MG TOTAL) BY MOUTH DAILY.   aspirin EC 81 MG tablet Take 81 mg by mouth daily.   diclofenac 75 MG EC tablet Commonly known as:  VOLTAREN Take 1 tablet (75 mg total) by mouth 2 (two) times daily.   hydrOXYzine 25 MG capsule Commonly known as:  VISTARIL Take 1 capsule (25 mg total) by mouth 2 (two) times daily.   metoprolol tartrate 100 MG tablet Commonly known as:   LOPRESSOR Take 1 tablet (100 mg total) by mouth 2 (two) times daily.   predniSONE 10 MG tablet Commonly known as:  DELTASONE Take 5 daily for 3 days followed by 4,3,2 and 1 for 3 days each.   sildenafil 20 MG tablet Commonly known as:  REVATIO Take 1 tablet (20 mg total) by mouth 3 (three) times daily.   triamterene-hydrochlorothiazide 37.5-25 MG tablet Commonly known as:  MAXZIDE-25 Take 1 tablet by mouth daily.   VITAMIN D3 PO Take by mouth.       Meds ordered this encounter  Medications  . metoprolol tartrate (LOPRESSOR) 100 MG tablet    Sig: Take 1 tablet (100 mg total) by mouth 2 (two) times daily.    Dispense:  180 tablet    Refill:  1  . triamterene-hydrochlorothiazide (MAXZIDE-25) 37.5-25 MG tablet    Sig: Take 1 tablet by mouth daily.    Dispense:  90 tablet    Refill:  1  . diclofenac (VOLTAREN) 75 MG EC tablet    Sig: Take 1 tablet (75 mg total) by mouth 2 (two) times daily.    Dispense:  180 tablet    Refill:  1  . hydrOXYzine (VISTARIL) 25 MG capsule    Sig: Take 1 capsule (25 mg total) by mouth 2 (two) times daily.    Dispense:  180 capsule    Refill:  1  . predniSONE (DELTASONE) 10 MG tablet    Sig: Take 5 daily for 3 days followed by 4,3,2 and 1 for 3 days each.    Dispense:  45 tablet    Refill:  0     Follow-up: Return in about 6 months (around 08/11/2019).  Claretta Fraise, M.D.

## 2019-02-08 NOTE — Patient Instructions (Signed)

## 2019-02-09 ENCOUNTER — Other Ambulatory Visit: Payer: Self-pay | Admitting: Family Medicine

## 2019-02-09 DIAGNOSIS — R972 Elevated prostate specific antigen [PSA]: Secondary | ICD-10-CM

## 2019-02-09 LAB — CMP14+EGFR
ALT: 19 IU/L (ref 0–44)
AST: 18 IU/L (ref 0–40)
Albumin/Globulin Ratio: 2.2 (ref 1.2–2.2)
Albumin: 4.6 g/dL (ref 3.8–4.8)
Alkaline Phosphatase: 34 IU/L — ABNORMAL LOW (ref 39–117)
BUN/Creatinine Ratio: 16 (ref 10–24)
BUN: 27 mg/dL (ref 8–27)
Bilirubin Total: 0.6 mg/dL (ref 0.0–1.2)
CO2: 25 mmol/L (ref 20–29)
Calcium: 9.8 mg/dL (ref 8.6–10.2)
Chloride: 100 mmol/L (ref 96–106)
Creatinine, Ser: 1.69 mg/dL — ABNORMAL HIGH (ref 0.76–1.27)
GFR calc Af Amer: 49 mL/min/{1.73_m2} — ABNORMAL LOW (ref >59)
GFR calc non Af Amer: 42 mL/min/{1.73_m2} — ABNORMAL LOW (ref >59)
Globulin, Total: 2.1 g/dL (ref 1.5–4.5)
Glucose: 100 mg/dL — ABNORMAL HIGH (ref 65–99)
Potassium: 4.5 mmol/L (ref 3.5–5.2)
Sodium: 142 mmol/L (ref 134–144)
Total Protein: 6.7 g/dL (ref 6.0–8.5)

## 2019-02-09 LAB — PSA, TOTAL AND FREE
PSA, Free Pct: 29.2 %
PSA, Free: 1.52 ng/mL
Prostate Specific Ag, Serum: 5.2 ng/mL — ABNORMAL HIGH (ref 0.0–4.0)

## 2019-02-09 LAB — CBC WITH DIFFERENTIAL/PLATELET
Basophils Absolute: 0.1 10*3/uL (ref 0.0–0.2)
Basos: 1 %
EOS (ABSOLUTE): 0.5 10*3/uL — ABNORMAL HIGH (ref 0.0–0.4)
Eos: 8 %
Hematocrit: 35.5 % — ABNORMAL LOW (ref 37.5–51.0)
Hemoglobin: 12.1 g/dL — ABNORMAL LOW (ref 13.0–17.7)
Immature Grans (Abs): 0 10*3/uL (ref 0.0–0.1)
Immature Granulocytes: 1 %
Lymphocytes Absolute: 1.9 10*3/uL (ref 0.7–3.1)
Lymphs: 29 %
MCH: 30.4 pg (ref 26.6–33.0)
MCHC: 34.1 g/dL (ref 31.5–35.7)
MCV: 89 fL (ref 79–97)
Monocytes Absolute: 0.9 10*3/uL (ref 0.1–0.9)
Monocytes: 13 %
Neutrophils Absolute: 3.4 10*3/uL (ref 1.4–7.0)
Neutrophils: 48 %
Platelets: 219 10*3/uL (ref 150–450)
RBC: 3.98 x10E6/uL — ABNORMAL LOW (ref 4.14–5.80)
RDW: 11.3 % — ABNORMAL LOW (ref 11.6–15.4)
WBC: 6.8 10*3/uL (ref 3.4–10.8)

## 2019-02-09 LAB — LIPID PANEL
Chol/HDL Ratio: 4.1 ratio (ref 0.0–5.0)
Cholesterol, Total: 189 mg/dL (ref 100–199)
HDL: 46 mg/dL (ref >39)
LDL Calculated: 115 mg/dL — ABNORMAL HIGH (ref 0–99)
Triglycerides: 138 mg/dL (ref 0–149)
VLDL Cholesterol Cal: 28 mg/dL (ref 5–40)

## 2019-06-29 ENCOUNTER — Encounter: Payer: Self-pay | Admitting: *Deleted

## 2019-08-04 ENCOUNTER — Other Ambulatory Visit: Payer: Self-pay | Admitting: Family Medicine

## 2019-08-04 DIAGNOSIS — I10 Essential (primary) hypertension: Secondary | ICD-10-CM

## 2019-08-09 ENCOUNTER — Other Ambulatory Visit: Payer: Self-pay

## 2019-08-11 ENCOUNTER — Other Ambulatory Visit: Payer: Self-pay

## 2019-08-11 ENCOUNTER — Ambulatory Visit: Payer: BLUE CROSS/BLUE SHIELD | Admitting: Family Medicine

## 2019-08-11 ENCOUNTER — Encounter: Payer: Self-pay | Admitting: Family Medicine

## 2019-08-11 VITALS — BP 144/74 | HR 50 | Temp 98.9°F | Resp 20 | Ht 71.0 in | Wt 266.4 lb

## 2019-08-11 DIAGNOSIS — Z1211 Encounter for screening for malignant neoplasm of colon: Secondary | ICD-10-CM

## 2019-08-11 DIAGNOSIS — E782 Mixed hyperlipidemia: Secondary | ICD-10-CM | POA: Diagnosis not present

## 2019-08-11 DIAGNOSIS — G8929 Other chronic pain: Secondary | ICD-10-CM

## 2019-08-11 DIAGNOSIS — N529 Male erectile dysfunction, unspecified: Secondary | ICD-10-CM

## 2019-08-11 DIAGNOSIS — R972 Elevated prostate specific antigen [PSA]: Secondary | ICD-10-CM

## 2019-08-11 DIAGNOSIS — Z125 Encounter for screening for malignant neoplasm of prostate: Secondary | ICD-10-CM

## 2019-08-11 DIAGNOSIS — M1990 Unspecified osteoarthritis, unspecified site: Secondary | ICD-10-CM

## 2019-08-11 DIAGNOSIS — M25511 Pain in right shoulder: Secondary | ICD-10-CM

## 2019-08-11 DIAGNOSIS — I10 Essential (primary) hypertension: Secondary | ICD-10-CM

## 2019-08-11 MED ORDER — SILDENAFIL CITRATE 20 MG PO TABS: 20.0000 mg | ORAL_TABLET | Freq: Three times a day (TID) | ORAL | 5 refills | Status: DC

## 2019-08-11 MED ORDER — HYDROXYZINE PAMOATE 25 MG PO CAPS: 25.0000 mg | ORAL_CAPSULE | Freq: Two times a day (BID) | ORAL | 1 refills | Status: DC

## 2019-08-11 MED ORDER — AMLODIPINE BESYLATE 10 MG PO TABS: ORAL_TABLET | ORAL | 3 refills | Status: DC

## 2019-08-11 MED ORDER — LEVOCETIRIZINE DIHYDROCHLORIDE 5 MG PO TABS: 5.0000 mg | ORAL_TABLET | Freq: Every evening | ORAL | 3 refills | Status: DC

## 2019-08-11 MED ORDER — DICLOFENAC SODIUM 75 MG PO TBEC: 75.0000 mg | DELAYED_RELEASE_TABLET | Freq: Two times a day (BID) | ORAL | 1 refills | Status: DC

## 2019-08-11 NOTE — Progress Notes (Signed)
Subjective:  Patient ID: Gregory Evans,  male    DOB: [birth year]  Age: 63 y.o.    CC: Medical Management of Chronic Issues (six month recheck)   HPI Gregory Evans presents for  follow-up of hypertension. Patient has no history of headache chest pain or shortness of breath or recent cough. Patient also denies symptoms of TIA such as numbness weakness lateralizing. Patient denies side effects from medication. States taking it regularly.  Patient also  in for follow-up of elevated cholesterol. Doing well without complaints on current medication. Denies side effects  including myalgia and arthralgia and nausea. Also in today for liver function testing. Currently no chest pain, shortness of breath or other cardiovascular related symptoms noted.  Concerned about elevated PSA. Hasn't seen urology. Also due for colonoscopy. Its been 10 years.  History Gregory Evans has a past medical history of Allergy, Colon polyp (2008), Erectile dysfunction, Hyperlipidemia, Hypertension, Osteoarthritis, and Vitamin D deficiency.   Gregory Evans has no past surgical history on file.   Gregory Evans family history includes Dementia in Gregory Evans mother; Diabetes (age of onset: 53) in Gregory Evans father; Hypertension in Gregory Evans mother.Gregory Evans reports that Gregory Evans quit smoking about 25 years ago. Gregory Evans has never used smokeless tobacco. Gregory Evans reports current alcohol use of about 14.0 standard drinks of alcohol per week. Gregory Evans reports that Gregory Evans does not use drugs.  Current Outpatient Medications on File Prior to Visit  Medication Sig Dispense Refill  . aspirin EC 81 MG tablet Take 81 mg by mouth daily.    . Cholecalciferol (VITAMIN D3 PO) Take by mouth.    . metoprolol tartrate (LOPRESSOR) 100 MG tablet TAKE 1 TABLET BY MOUTH TWICE A DAY 180 tablet 0  . [DISCONTINUED] nebivolol (BYSTOLIC) 10 MG tablet Take 2 tablets (20 mg total) by mouth daily. 60 tablet 0   No current facility-administered medications on file prior to visit.     ROS Review of Systems  Objective:  BP (!)  144/74   Pulse (!) 50   Temp 98.9 F (37.2 C) (Temporal)   Resp 20   Ht 5' 11"  (1.803 m)   Wt 266 lb 6.4 oz (120.8 kg)   SpO2 97%   BMI 37.16 kg/m   BP Readings from Last 3 Encounters:  08/11/19 (!) 144/74  02/08/19 121/74  08/10/18 113/71    Wt Readings from Last 3 Encounters:  08/11/19 266 lb 6.4 oz (120.8 kg)  02/08/19 255 lb 2 oz (115.7 kg)  08/10/18 272 lb 2 oz (123.4 kg)     Physical Exam Constitutional:      General: Gregory Evans is not in acute distress.    Appearance: Gregory Evans is well-developed.  HENT:     Head: Normocephalic and atraumatic.     Right Ear: External ear normal.     Left Ear: External ear normal.     Nose: Nose normal.  Eyes:     Conjunctiva/sclera: Conjunctivae normal.     Pupils: Pupils are equal, round, and reactive to light.  Neck:     Musculoskeletal: Normal range of motion and neck supple.  Cardiovascular:     Rate and Rhythm: Normal rate and regular rhythm.     Heart sounds: Normal heart sounds. No murmur.  Pulmonary:     Effort: Pulmonary effort is normal. No respiratory distress.     Breath sounds: Normal breath sounds. No wheezing or rales.  Abdominal:     Palpations: Abdomen is soft.     Tenderness: There is no abdominal tenderness.  Musculoskeletal:  Normal range of motion.        General: Swelling and tenderness (RUE with decreased abduction and external rotation due to guarding) present.  Skin:    General: Skin is warm and dry.  Neurological:     Mental Status: Gregory Evans is alert and oriented to person, place, and time.     Deep Tendon Reflexes: Reflexes are normal and symmetric.  Psychiatric:        Behavior: Behavior normal.        Thought Content: Thought content normal.        Judgment: Judgment normal.         Assessment & Plan:   Beverly was seen today for medical management of chronic issues.  Diagnoses and all orders for this visit:  Essential hypertension -     CBC with Differential/Platelet -     CMP14+EGFR -     Lipid  panel  Osteoarthritis, unspecified osteoarthritis type, unspecified site -     diclofenac (VOLTAREN) 75 MG EC tablet; Take 1 tablet (75 mg total) by mouth 2 (two) times daily. -     CBC with Differential/Platelet -     CMP14+EGFR -     Lipid panel  Erectile dysfunction, unspecified erectile dysfunction type -     sildenafil (REVATIO) 20 MG tablet; Take 1 tablet (20 mg total) by mouth 3 (three) times daily. -     CBC with Differential/Platelet -     CMP14+EGFR -     Lipid panel  Mixed hyperlipidemia -     CBC with Differential/Platelet -     CMP14+EGFR -     Lipid panel  Screening for prostate cancer -     PSA Total (Reflex To Free)  Screen for colon cancer -     Ambulatory referral to Gastroenterology  Elevated PSA -     Ambulatory referral to Urology  Chronic right shoulder pain -     MR Shoulder Right Wo Contrast; Future  Other orders -     amLODipine (NORVASC) 10 MG tablet; TAKE 1 TABLET (10 MG TOTAL) BY MOUTH DAILY. -     hydrOXYzine (VISTARIL) 25 MG capsule; Take 1 capsule (25 mg total) by mouth 2 (two) times daily. -     levocetirizine (XYZAL) 5 MG tablet; Take 1 tablet (5 mg total) by mouth every evening.   I have discontinued Sahaj Cuny's predniSONE. I am also having him start on levocetirizine. Additionally, I am having him maintain Gregory Evans aspirin EC, Cholecalciferol (VITAMIN D3 PO), metoprolol tartrate, amLODipine, diclofenac, hydrOXYzine, and sildenafil.  Meds ordered this encounter  Medications  . amLODipine (NORVASC) 10 MG tablet    Sig: TAKE 1 TABLET (10 MG TOTAL) BY MOUTH DAILY.    Dispense:  90 tablet    Refill:  3  . diclofenac (VOLTAREN) 75 MG EC tablet    Sig: Take 1 tablet (75 mg total) by mouth 2 (two) times daily.    Dispense:  180 tablet    Refill:  1  . hydrOXYzine (VISTARIL) 25 MG capsule    Sig: Take 1 capsule (25 mg total) by mouth 2 (two) times daily.    Dispense:  180 capsule    Refill:  1  . sildenafil (REVATIO) 20 MG tablet    Sig:  Take 1 tablet (20 mg total) by mouth 3 (three) times daily.    Dispense:  100 tablet    Refill:  5  . levocetirizine (XYZAL) 5 MG tablet    Sig:  Take 1 tablet (5 mg total) by mouth every evening.    Dispense:  90 tablet    Refill:  3     Follow-up: Return in about 6 months (around 02/08/2020).  Claretta Fraise, M.D.

## 2019-08-12 ENCOUNTER — Ambulatory Visit: Payer: BLUE CROSS/BLUE SHIELD | Admitting: Family Medicine

## 2019-08-12 LAB — LIPID PANEL
Chol/HDL Ratio: 3.3 ratio (ref 0.0–5.0)
Cholesterol, Total: 153 mg/dL (ref 100–199)
HDL: 47 mg/dL (ref >39)
LDL Chol Calc (NIH): 90 mg/dL (ref 0–99)
Triglycerides: 84 mg/dL (ref 0–149)
VLDL Cholesterol Cal: 16 mg/dL (ref 5–40)

## 2019-08-12 LAB — CBC WITH DIFFERENTIAL/PLATELET
Basophils Absolute: 0 10*3/uL (ref 0.0–0.2)
Basos: 1 %
EOS (ABSOLUTE): 0.4 10*3/uL (ref 0.0–0.4)
Eos: 6 %
Hematocrit: 38.3 % (ref 37.5–51.0)
Hemoglobin: 12.6 g/dL — ABNORMAL LOW (ref 13.0–17.7)
Immature Grans (Abs): 0 10*3/uL (ref 0.0–0.1)
Immature Granulocytes: 0 %
Lymphocytes Absolute: 1.7 10*3/uL (ref 0.7–3.1)
Lymphs: 26 %
MCH: 29.4 pg (ref 26.6–33.0)
MCHC: 32.9 g/dL (ref 31.5–35.7)
MCV: 90 fL (ref 79–97)
Monocytes Absolute: 0.7 10*3/uL (ref 0.1–0.9)
Monocytes: 11 %
Neutrophils Absolute: 3.7 10*3/uL (ref 1.4–7.0)
Neutrophils: 56 %
Platelets: 190 10*3/uL (ref 150–450)
RBC: 4.28 x10E6/uL (ref 4.14–5.80)
RDW: 11.6 % (ref 11.6–15.4)
WBC: 6.5 10*3/uL (ref 3.4–10.8)

## 2019-08-12 LAB — CMP14+EGFR
ALT: 25 IU/L (ref 0–44)
AST: 23 IU/L (ref 0–40)
Albumin/Globulin Ratio: 1.8 (ref 1.2–2.2)
Albumin: 4.5 g/dL (ref 3.8–4.8)
Alkaline Phosphatase: 46 IU/L (ref 39–117)
BUN/Creatinine Ratio: 18 (ref 10–24)
BUN: 18 mg/dL (ref 8–27)
Bilirubin Total: 0.6 mg/dL (ref 0.0–1.2)
CO2: 21 mmol/L (ref 20–29)
Calcium: 9.7 mg/dL (ref 8.6–10.2)
Chloride: 101 mmol/L (ref 96–106)
Creatinine, Ser: 1 mg/dL (ref 0.76–1.27)
GFR calc Af Amer: 91 mL/min/{1.73_m2} (ref >59)
GFR calc non Af Amer: 79 mL/min/{1.73_m2} (ref >59)
Globulin, Total: 2.5 g/dL (ref 1.5–4.5)
Sodium: 142 mmol/L (ref 134–144)
Total Protein: 7 g/dL (ref 6.0–8.5)

## 2019-08-12 LAB — FPSA% REFLEX
% FREE PSA: 30 %
PSA, FREE: 1.26 ng/mL

## 2019-08-12 LAB — PSA TOTAL (REFLEX TO FREE): Prostate Specific Ag, Serum: 4.2 ng/mL — ABNORMAL HIGH (ref 0.0–4.0)

## 2019-08-17 ENCOUNTER — Other Ambulatory Visit: Payer: Self-pay | Admitting: Family Medicine

## 2019-08-17 DIAGNOSIS — G8929 Other chronic pain: Secondary | ICD-10-CM

## 2019-08-19 ENCOUNTER — Encounter: Payer: Self-pay | Admitting: *Deleted

## 2019-08-19 ENCOUNTER — Encounter: Payer: Self-pay | Admitting: Internal Medicine

## 2019-08-30 ENCOUNTER — Other Ambulatory Visit: Payer: Self-pay

## 2019-08-30 ENCOUNTER — Encounter: Payer: Self-pay | Admitting: Physical Therapy

## 2019-08-30 ENCOUNTER — Ambulatory Visit: Payer: BC Managed Care – PPO | Attending: Family Medicine | Admitting: Physical Therapy

## 2019-08-30 DIAGNOSIS — G8929 Other chronic pain: Secondary | ICD-10-CM | POA: Insufficient documentation

## 2019-08-30 DIAGNOSIS — M25611 Stiffness of right shoulder, not elsewhere classified: Secondary | ICD-10-CM | POA: Diagnosis not present

## 2019-08-30 DIAGNOSIS — M25511 Pain in right shoulder: Secondary | ICD-10-CM | POA: Insufficient documentation

## 2019-08-30 DIAGNOSIS — R293 Abnormal posture: Secondary | ICD-10-CM | POA: Insufficient documentation

## 2019-08-30 NOTE — Therapy (Signed)
[facility] [place], [place], [address] Phone: [phone]   Fax:  [phone]  Physical Therapy Evaluation  Patient Details  Name: Gregory Evans MRN: [id number] Date of Birth: 1956-09-19 Referring Provider (PT): Claretta Fraise, MD   Encounter Date: 08/30/2019  PT End of Session - 08/30/19 1614    Visit Number  1    Number of Visits  12    Date for PT Re-Evaluation  10/18/19    Authorization Type  FOTO; progress note every 10th visit    PT Start Time  1515    PT Stop Time  1559    PT Time Calculation (min)  44 min    Activity Tolerance  Patient tolerated treatment well    Behavior During Therapy  [facility] for tasks assessed/performed       Past Medical History:  Diagnosis Date  . Allergy   . Colon polyp 2008  . Erectile dysfunction   . Hyperlipidemia   . Hypertension   . Osteoarthritis   . Vitamin D deficiency     History reviewed. No pertinent surgical history.  There were no vitals filed for this visit.   Subjective Assessment - 08/30/19 1944    Subjective  COVID-19 screening performed upon arrival.Patient arrives to physical therapy with reports of right shoulder pain and decreased right shoulder ROM that has progressively worsened over the past year. Patient reports difficulties with lifting overhead, showering activities, and reaching behind the back for dressing. Patient reports pain at worst as 10/10 and pain at best at 0/10 with rest and Tylenol PRN. Patient's goals are to decrease pain, improve movement, and return to PLOF and 100% mobility.    Pertinent History  HTN    Limitations  Lifting;House hold activities    Diagnostic tests  x-ray: "inflammation"    Patient Stated Goals  improve movement and return to 100% mobility    Currently in Pain?  Yes    Pain Score  4     Pain Location  Shoulder    Pain Orientation  Left    Pain Descriptors / Indicators  Sore    Pain Type  Chronic pain    Pain Onset  More  than a month ago    Pain Frequency  Intermittent    Aggravating Factors   overhead activities, crossing arm across, reaching behind the back    Pain Relieving Factors  tylenol PRN    Effect of Pain on Evans Activities  pain with movement; increased time to perform activities         [facility] PT Assessment - 08/30/19 0001      Assessment   Medical Diagnosis  Chronic shoulder pain    Referring Provider (PT)  Claretta Fraise, MD    Onset Date/Surgical Date  --   over 1 year   Hand Dominance  Right    Next MD Visit  March 2021    Prior Therapy  no      Precautions   Precautions  None      Restrictions   Weight Bearing Restrictions  No      Balance Screen   Has the patient fallen in the past 6 months  No    Has the patient had a decrease in activity level because of a fear of falling?   No    Is the patient reluctant to leave their home because of a fear of falling?   No      Home Environment  Living Environment  Private residence    Living Arrangements  Spouse/significant other      Prior Function   Level of Independence  Independent    Vocation  Full time employment      Observation/Other Assessments   Focus on Therapeutic Outcomes ([place])   to be completed next visit      Posture/Postural Control   Posture/Postural Control  Postural limitations    Postural Limitations  Forward head;Rounded Shoulders      ROM / Strength   AROM / PROM / Strength  AROM;Strength      AROM   AROM Assessment Site  Shoulder    Right/Left Shoulder  Right    Right Shoulder Flexion  126 Degrees    Right Shoulder ABduction  102 Degrees    Right Shoulder Internal Rotation  --   to abdomen   Right Shoulder External Rotation  50 Degrees      Strength   Strength Assessment Site  Shoulder    Right/Left Shoulder  Right    Right Shoulder Flexion  4-/5    Right Shoulder ABduction  3+/5    Right Shoulder Internal Rotation  4-/5    Right Shoulder External Rotation  3+/5      Palpation    Palpation comment  minimal tenderness to palpation to right shoulder      Special Tests    Special Tests  Rotator Cuff Impingement    Rotator Cuff Impingment tests  Michel Bickers test      Hawkins-Kennedy test   Findings  Positive    Side  Right                Objective measurements completed on examination: See above findings.              PT Education - 08/30/19 1954    Education Details  scapular retractions, corner stretch, AAROM shoulder flexion, shoulder ER, and ABD    Person(s) Educated  Patient    Methods  Explanation;Demonstration;Handout    Comprehension  Verbalized understanding;Returned demonstration          PT Long Term Goals - 08/30/19 1958      PT LONG TERM GOAL #1   Title  Patient will be independent with HEP.    Time  6    Period  Weeks    Status  New      PT LONG TERM GOAL #2   Title  Patient will demonstrate 150+ degrees of left shoulder AROM to improve ability to perform overhead tasks.    Time  6    Period  Weeks    Status  New      PT LONG TERM GOAL #3   Title  Patient will demonstrate 60+ degrees of right shoulder ER AROM to improve ability to don/doff apparel.    Time  6    Period  Weeks    Status  New      PT LONG TERM GOAL #4   Title  Patient will demonstrate behind the back IR to L4 or higher for dressing activities.    Period  Weeks    Status  New      PT LONG TERM GOAL #5   Title  Patient will report ability to perform ADLs and work activities with right shoulder pain less than or equal to 3/10.    Time  6    Period  Weeks    Status  New  Plan - 08/30/19 1614    Clinical Impression Statement  Patient is a 63 year old male who presents to physical therapy with decreased right shoulder AROM, decreased right shoulder MMT, and abnormal posture. Patient demonstrates forward head and rounded shoulders. Patient noted with pain and tightness at end range PROM. Patient (+) right Hawkin's Kennedy  test with reproduction of pain. Patient and PT discussed HEP as well as plan of care to address the shoulder. Patient reported understanding and agreement. Patient would benefit from skilled physical therapy to address deficits and address patient's goals.    Personal Factors and Comorbidities  Comorbidity 1    Comorbidities  HTN    Examination-Activity Limitations  Carry;Reach Overhead;Hygiene/Grooming;Dressing;Bathing    Examination-Participation Restrictions  Cleaning    Stability/Clinical Decision Making  Stable/Uncomplicated    Clinical Decision Making  Low    Rehab Potential  Good    PT Frequency  2x / week    PT Duration  6 weeks    PT Treatment/Interventions  ADLs/Self Care Home Management;Cryotherapy;Electrical Stimulation;Ultrasound;Moist Heat;Iontophoresis 4mg /ml Dexamethasone;Neuromuscular re-education;Manual techniques;Patient/family education;Therapeutic activities;Therapeutic exercise;Passive range of motion;Vasopneumatic Device;Taping    PT Next Visit Plan  FOTO (already set up); AAROM to right shoulder; postural exercises, ultrasound/combo, modalities PRN for pain relif    PT Home Exercise Plan  see patient education section    Consulted and Agree with Plan of Care  Patient       Patient will benefit from skilled therapeutic intervention in order to improve the following deficits and impairments:  Decreased activity tolerance, Decreased balance, Decreased strength, Decreased range of motion, Impaired UE functional use, Pain, Postural dysfunction  Visit Diagnosis: Chronic right shoulder pain - Plan: PT plan of care cert/re-cert  Stiffness of right shoulder, not elsewhere classified - Plan: PT plan of care cert/re-cert  Abnormal posture - Plan: PT plan of care cert/re-cert     Problem List Patient Active Problem List   Diagnosis Date Noted  . Umbilical hernia with obstruction, without gangrene 10/16/2017  . Angioedema 03/19/2016  . Obese 08/19/2013  . Hypertension    . Colon polyp   . Allergy   . Vitamin D deficiency   . Osteoarthritis   . Erectile dysfunction   . Hyperlipidemia     [name], PT, DPT 08/30/2019, 9:33 PM  [facility] [address] [place], [place], [address] Phone: [phone]   Fax:  [phone]  Name: Gregory Evans MRN: [id number] Date of Birth: [birth year]

## 2019-09-01 ENCOUNTER — Encounter: Payer: Self-pay | Admitting: Physical Therapy

## 2019-09-01 ENCOUNTER — Ambulatory Visit: Payer: BC Managed Care – PPO | Admitting: Physical Therapy

## 2019-09-01 ENCOUNTER — Other Ambulatory Visit: Payer: Self-pay

## 2019-09-01 DIAGNOSIS — G8929 Other chronic pain: Secondary | ICD-10-CM

## 2019-09-01 DIAGNOSIS — R293 Abnormal posture: Secondary | ICD-10-CM | POA: Diagnosis not present

## 2019-09-01 DIAGNOSIS — M25511 Pain in right shoulder: Secondary | ICD-10-CM

## 2019-09-01 DIAGNOSIS — M25611 Stiffness of right shoulder, not elsewhere classified: Secondary | ICD-10-CM

## 2019-09-01 NOTE — Therapy (Signed)
[facility] [place], [place], [address] Phone: [phone]   Fax:  [phone]  Physical Therapy Treatment  Patient Details  Name: Gregory Evans MRN: [id number] Date of Birth: 11/15/1956 Referring Provider (PT): Claretta Fraise, MD   Encounter Date: 09/01/2019  PT End of Session - 09/01/19 1606    Visit Number  2    Number of Visits  12    Date for PT Re-Evaluation  10/18/19    Authorization Type  FOTO; progress note every 10th visit    PT Start Time  1602    PT Stop Time  1654    PT Time Calculation (min)  52 min    Activity Tolerance  Patient tolerated treatment well    Behavior During Therapy  [facility] for tasks assessed/performed       Past Medical History:  Diagnosis Date  . Allergy   . Colon polyp 2008  . Erectile dysfunction   . Hyperlipidemia   . Hypertension   . Osteoarthritis   . Vitamin D deficiency     History reviewed. No pertinent surgical history.  There were no vitals filed for this visit.  Subjective Assessment - 09/01/19 1605    Subjective  COVID 19 screening performed on patient upon arrival. Patient reports more pain with reaching overhead but no pain at rest.    Pertinent History  HTN    Limitations  Lifting;House hold activities    Diagnostic tests  x-ray: "inflammation"    Patient Stated Goals  improve movement and return to 100% mobility    Currently in Pain?  Yes    Pain Score  8     Pain Location  Shoulder    Pain Orientation  Right    Pain Descriptors / Indicators  Discomfort    Pain Type  Chronic pain    Pain Onset  More than a month ago    Pain Frequency  Intermittent    Aggravating Factors   OH reaching         [facility] PT Assessment - 09/01/19 0001      Assessment   Medical Diagnosis  Chronic shoulder pain    Referring Provider (PT)  Claretta Fraise, MD    Hand Dominance  Right    Next MD Visit  March 2021    Prior Therapy  no      Precautions   Precautions  None      Restrictions   Weight Bearing Restrictions  No                   [facility] Adult PT Treatment/Exercise - 09/01/19 0001      Exercises   Exercises  Shoulder      Shoulder Exercises: Supine   Flexion  AAROM;Both;20 reps      Shoulder Exercises: Standing   Flexion  AAROM;Right;20 reps      Shoulder Exercises: Pulleys   Flexion  5 minutes      Shoulder Exercises: ROM/Strengthening   UBE (Upper Arm Bike)  120 RPM x6 min    Ranger  Standing UE ranger into flexion x30 reps      Modalities   Modalities  Teacher, English as a foreign language Location  R shoulder    Electrical Stimulation Action  Pre-Mod    Electrical Stimulation Parameters  80-150 hz x15 min    Electrical Stimulation Goals  Pain      Manual Therapy   Manual  Therapy  Passive ROM    Passive ROM  PROM of R shoulder into flexion with gentle holds at end range                  PT Long Term Goals - 08/30/19 1958      PT LONG TERM GOAL #1   Title  Patient will be independent with HEP.    Time  6    Period  Weeks    Status  New      PT LONG TERM GOAL #2   Title  Patient will demonstrate 150+ degrees of left shoulder AROM to improve ability to perform overhead tasks.    Time  6    Period  Weeks    Status  New      PT LONG TERM GOAL #3   Title  Patient will demonstrate 60+ degrees of right shoulder ER AROM to improve ability to don/doff apparel.    Time  6    Period  Weeks    Status  New      PT LONG TERM GOAL #4   Title  Patient will demonstrate behind the back IR to L4 or higher for dressing activities.    Period  Weeks    Status  New      PT LONG TERM GOAL #5   Title  Patient will report ability to perform ADLs and work activities with right shoulder pain less than or equal to 3/10.    Time  6    Period  Weeks    Status  New            Plan - 09/01/19 1652    Clinical Impression Statement  Patient presented in clinic with reports of  more pain with overhead reaching/flexion of R shoulder. Patient guided through [facility] exercises with greatest report of pain during AAROM flexion in supine. Constant crepitus noted during attempted PROM of R shoulder into ER. Firm end feels and intermittant crepitus noted during PROM of R shoulder into flexion. Normal modalties response noted following removal of the modalities. No complaints of pain reported by patient following end of treatment.    Personal Factors and Comorbidities  Comorbidity 1    Comorbidities  HTN    Examination-Activity Limitations  Carry;Reach Overhead;Hygiene/Grooming;Dressing;Bathing    Examination-Participation Restrictions  Cleaning    Stability/Clinical Decision Making  Stable/Uncomplicated    Rehab Potential  Good    PT Frequency  2x / week    PT Duration  6 weeks    PT Treatment/Interventions  ADLs/Self Care Home Management;Cryotherapy;Electrical Stimulation;Ultrasound;Moist Heat;Iontophoresis 4mg /ml Dexamethasone;Neuromuscular re-education;Manual techniques;Patient/family education;Therapeutic activities;Therapeutic exercise;Passive range of motion;Vasopneumatic Device;Taping    PT Next Visit Plan  Continue ROM with modalities PRN per POC.    PT Home Exercise Plan  see patient education section    Consulted and Agree with Plan of Care  Patient       Patient will benefit from skilled therapeutic intervention in order to improve the following deficits and impairments:  Decreased activity tolerance, Decreased balance, Decreased strength, Decreased range of motion, Impaired UE functional use, Pain, Postural dysfunction  Visit Diagnosis: Chronic right shoulder pain  Stiffness of right shoulder, not elsewhere classified  Abnormal posture     Problem List Patient Active Problem List   Diagnosis Date Noted  . Umbilical hernia with obstruction, without gangrene 10/16/2017  . Angioedema 03/19/2016  . Obese 08/19/2013  . Hypertension   . Colon polyp   .  Allergy   .  Vitamin D deficiency   . Osteoarthritis   . Erectile dysfunction   . Hyperlipidemia     Standley Brooking, PTA 09/01/2019, 4:57 PM  [facility] [address] [place], [place], [address] Phone: [phone]   Fax:  [phone]  Name: Gregory Evans MRN: [id number] Date of Birth: 31-Dec-1955

## 2019-09-06 ENCOUNTER — Encounter: Payer: Self-pay | Admitting: Physical Therapy

## 2019-09-06 ENCOUNTER — Ambulatory Visit: Payer: BC Managed Care – PPO | Admitting: Physical Therapy

## 2019-09-06 ENCOUNTER — Other Ambulatory Visit: Payer: Self-pay

## 2019-09-06 DIAGNOSIS — G8929 Other chronic pain: Secondary | ICD-10-CM | POA: Diagnosis not present

## 2019-09-06 DIAGNOSIS — M25611 Stiffness of right shoulder, not elsewhere classified: Secondary | ICD-10-CM

## 2019-09-06 DIAGNOSIS — R293 Abnormal posture: Secondary | ICD-10-CM | POA: Diagnosis not present

## 2019-09-06 DIAGNOSIS — M25511 Pain in right shoulder: Secondary | ICD-10-CM | POA: Diagnosis not present

## 2019-09-06 NOTE — Therapy (Signed)
[facility] [place], [place], [address] Phone: [phone]   Fax:  [phone]  Physical Therapy Treatment  Patient Details  Name: Gregory Evans MRN: [id number] Date of Birth: Jan 12, 1956 Referring Provider (PT): Claretta Fraise, MD   Encounter Date: 09/06/2019  PT End of Session - 09/06/19 1605    Visit Number  3    Number of Visits  12    Date for PT Re-Evaluation  10/18/19    Authorization Type  FOTO; progress note every 10th visit    PT Start Time  1601    PT Stop Time  1650    PT Time Calculation (min)  49 min    Activity Tolerance  Patient tolerated treatment well    Behavior During Therapy  [facility] for tasks assessed/performed       Past Medical History:  Diagnosis Date  . Allergy   . Colon polyp 2008  . Erectile dysfunction   . Hyperlipidemia   . Hypertension   . Osteoarthritis   . Vitamin D deficiency     History reviewed. No pertinent surgical history.  There were no vitals filed for this visit.  Subjective Assessment - 09/06/19 1607    Subjective  COVID 19 screening performed on patient upon arrival. Patient reports no pain at rest and reports he is able to sleep better throughout the night.    Pertinent History  HTN    Limitations  Lifting;House hold activities    Diagnostic tests  x-ray: "inflammation"    Patient Stated Goals  improve movement and return to 100% mobility    Currently in Pain?  No/denies                       [facility] Adult PT Treatment/Exercise - 09/06/19 0001      Exercises   Exercises  Shoulder      Shoulder Exercises: Standing   Protraction  Strengthening;Right;20 reps;Theraband    Theraband Level (Shoulder Protraction)  Level 1 (Yellow)    External Rotation  Strengthening;Right;20 reps;Theraband    Theraband Level (Shoulder External Rotation)  Level 1 (Yellow)    Internal Rotation  Strengthening;Right;20 reps;Theraband    Theraband Level (Shoulder Internal  Rotation)  Level 1 (Yellow)    Row  Strengthening;Right;20 reps;Theraband    Theraband Level (Shoulder Row)  Level 1 (Yellow)      Shoulder Exercises: Pulleys   Flexion  5 minutes      Shoulder Exercises: ROM/Strengthening   UBE (Upper Arm Bike)  120 RPM x8 min    Wall Wash  x20 into flexion    Other ROM/Strengthening Exercises  wall clock x5 with emphasis on scapular retraction      Modalities   Modalities  Electrical Stimulation;Vasopneumatic      Electrical Stimulation   Electrical Stimulation Location  R shoulder    Electrical Stimulation Action  pre-mod    Electrical Stimulation Parameters  80-150 hz x10 mins    Electrical Stimulation Goals  Pain      Vasopneumatic   Number Minutes Vasopneumatic   10 minutes    Vasopnuematic Location   Shoulder    Vasopneumatic Pressure  Low    Vasopneumatic Temperature   50                  PT Long Term Goals - 08/30/19 1958      PT LONG TERM GOAL #1   Title  Patient will be independent with HEP.  Time  6    Period  Weeks    Status  New      PT LONG TERM GOAL #2   Title  Patient will demonstrate 150+ degrees of left shoulder AROM to improve ability to perform overhead tasks.    Time  6    Period  Weeks    Status  New      PT LONG TERM GOAL #3   Title  Patient will demonstrate 60+ degrees of right shoulder ER AROM to improve ability to don/doff apparel.    Time  6    Period  Weeks    Status  New      PT LONG TERM GOAL #4   Title  Patient will demonstrate behind the back IR to L4 or higher for dressing activities.    Period  Weeks    Status  New      PT LONG TERM GOAL #5   Title  Patient will report ability to perform ADLs and work activities with right shoulder pain less than or equal to 3/10.    Time  6    Period  Weeks    Status  New            Plan - 09/06/19 1734    Clinical Impression Statement  Patient responded well to progression of exercises but with slight increase of pain in the right  shoulder. Patient with audible crepitus with wall washing into flexion. Patient denied significant pain with crepitus but reports it causes some increase of soreness. Patient and PT discussed finding a balance of doing enough but not overdoing it to progress in pain, ROM and strength. Patient reported understanding. Vasopneumatic device initiated today with normal response to modalities.    Personal Factors and Comorbidities  Comorbidity 1    Comorbidities  HTN    Examination-Activity Limitations  Carry;Reach Overhead;Hygiene/Grooming;Dressing;Bathing    Examination-Participation Restrictions  Cleaning    Stability/Clinical Decision Making  Stable/Uncomplicated    Clinical Decision Making  Low    Rehab Potential  Good    PT Frequency  2x / week    PT Duration  6 weeks    PT Treatment/Interventions  ADLs/Self Care Home Management;Cryotherapy;Electrical Stimulation;Ultrasound;Moist Heat;Iontophoresis 4mg /ml Dexamethasone;Neuromuscular re-education;Manual techniques;Patient/family education;Therapeutic activities;Therapeutic exercise;Passive range of motion;Vasopneumatic Device;Taping    PT Next Visit Plan  Continue ROM with modalities PRN per POC vaso and E-stim    PT Home Exercise Plan  see patient education section    Consulted and Agree with Plan of Care  Patient       Patient will benefit from skilled therapeutic intervention in order to improve the following deficits and impairments:  Decreased activity tolerance, Decreased balance, Decreased strength, Decreased range of motion, Impaired UE functional use, Pain, Postural dysfunction  Visit Diagnosis: Chronic right shoulder pain  Stiffness of right shoulder, not elsewhere classified  Abnormal posture     Problem List Patient Active Problem List   Diagnosis Date Noted  . Umbilical hernia with obstruction, without gangrene 10/16/2017  . Angioedema 03/19/2016  . Obese 08/19/2013  . Hypertension   . Colon polyp   . Allergy   .  Vitamin D deficiency   . Osteoarthritis   . Erectile dysfunction   . Hyperlipidemia     [name], PT, DPT 09/06/2019, 5:49 PM  [facility] [place], [place], [address] Phone: [phone]   Fax:  [phone]  Name: Frederik Hartsock MRN: [id number] Date of Birth: Jan 24, 1956

## 2019-09-07 DIAGNOSIS — Z23 Encounter for immunization: Secondary | ICD-10-CM | POA: Diagnosis not present

## 2019-09-08 ENCOUNTER — Ambulatory Visit: Payer: BC Managed Care – PPO | Admitting: Physical Therapy

## 2019-09-08 ENCOUNTER — Other Ambulatory Visit: Payer: Self-pay

## 2019-09-08 ENCOUNTER — Encounter: Payer: Self-pay | Admitting: Physical Therapy

## 2019-09-08 DIAGNOSIS — M25611 Stiffness of right shoulder, not elsewhere classified: Secondary | ICD-10-CM

## 2019-09-08 DIAGNOSIS — M25511 Pain in right shoulder: Secondary | ICD-10-CM | POA: Diagnosis not present

## 2019-09-08 DIAGNOSIS — G8929 Other chronic pain: Secondary | ICD-10-CM | POA: Diagnosis not present

## 2019-09-08 DIAGNOSIS — R293 Abnormal posture: Secondary | ICD-10-CM

## 2019-09-08 NOTE — Patient Instructions (Signed)
[facility]  DRY NEEDLING CONSENT FORM   Trigger point dry needling is a physical therapy approach to treat Myofascial Pain and Dysfunction.  Dry Needling (DN) is a valuable and effective way to deactivate myofascial trigger points (muscle knots/pain). It is skilled intervention that uses a thin filiform needle to penetrate the skin and stimulate underlying myofascial trigger points, muscular, and connective tissues for the management of neuromusculoskeletal pain and movement impairments.  A local twitch response (LTR) will be elicited.  This can sometimes feel like a deep ache in the muscle during the procedure. Multiple trigger points in multiple muscles can be treated during each treatment.  No medication of any kind is injected.   As with any medical treatment and procedure, there are possible adverse events.  While significant adverse events are uncommon, they do sometimes occur and must be considered prior to giving consent.  1. Dry needling often causes a "post needling soreness".  There can be an increase in pain from a couple of hours to 2-3 days, followed by an improvement in the overall pain state. 2. Any time a needle is used there is a risk of infection.  However, we are using new, sterile, and disposable needles; infections are extremely rare. 3. There is a possibility that you may bleed or bruise.  You may feel tired and some nausea following treatment. 4. There is a rare possibility of a pneumothorax (air in the chest cavity). 5. Allergic reaction to nickel in the stainless steel needle. 6. If a nerve is touched, it may cause paresthesia (a prickling/shock sensation) which is usually brief, but may continue for a couple of days.  Following treatment stay hydrated.  Continue regular activities but not too vigorous initially after treatment for 24-48 hours.  Dry Needling is best when combined with other physical therapy interventions such as  strengthening, stretching and other therapeutic modalities.   PLEASE ANSWER THE FOLLOWING QUESTIONS:  Do you have a lack of sensation?   Y/N  Do you have a phobia or fear of needles  Y/N  Are you pregnant?    Y/N If yes:  How many weeks? __________ Do you have any implanted devices?  Y/N If yes:  Pacemaker/Spinal Cord Stimulator/Deep Brain Stimulator/Insulin Pump/Other: ________________ Do you have any implants?  Y/N If yes: Breast/Facial/Pecs/Buttocks/Calves/Hip  Replacement/ Knee Replacement/Other: _________ Do you take any blood thinners?   Y/N If yes: Coumadin (Warfarin)/Other: ___________________ Do you have a bleeding disorder?   Y/N If yes: What kind: _________________________________ Do you take any immunosuppressants?  Y/N If yes:   What kind: _________________________________ Do you take anti-inflammatories?   Y/N If yes: What kind: Advil/Aspirin/Other: ________________ Have you ever been diagnosed with Scoliosis? Y/N Have you had back surgery?   Y/N If yes:  Laminectomy/Fusion/Other: ___________________   I have read, or had read to me, the above.  I have had the opportunity to ask any questions.  All of my questions have been answered to my satisfaction and I understand the risks involved with dry needling.  I consent to [facility] and treatment [facility], including dry needling, of any and all of my involved and affected muscles.  

## 2019-09-08 NOTE — Therapy (Signed)
[facility] [place], [place], [address] Phone: [phone]   Fax:  [phone]  Physical Therapy Treatment  Patient Details  Name: Gregory Evans MRN: [id number] Date of Birth: Oct 22, 1956 Referring Provider (PT): Claretta Fraise, MD   Encounter Date: 09/08/2019  PT End of Session - 09/08/19 1717    Visit Number  4    Number of Visits  12    Date for PT Re-Evaluation  10/18/19    Authorization Type  FOTO; progress note every 10th visit    PT Start Time  0400    PT Stop Time  0505    PT Time Calculation (min)  65 min    Activity Tolerance  Patient tolerated treatment well    Behavior During Therapy  [facility] for tasks assessed/performed       Past Medical History:  Diagnosis Date  . Allergy   . Colon polyp 2008  . Erectile dysfunction   . Hyperlipidemia   . Hypertension   . Osteoarthritis   . Vitamin D deficiency     History reviewed. No pertinent surgical history.  There were no vitals filed for this visit.  Subjective Assessment - 09/08/19 1656    Subjective  COVID-19 screen performed prior to patient entering clinic.  Shoulder was hurting quite a bit so I took 2 Tylenol.  Pain coming back and aching now.    Pertinent History  HTN    Limitations  Lifting;House hold activities    Diagnostic tests  x-ray: "inflammation"    Patient Stated Goals  improve movement and return to 100% mobility    Currently in Pain?  Yes    Pain Score  7     Pain Location  Shoulder    Pain Orientation  Right    Pain Descriptors / Indicators  Discomfort;Aching    Pain Type  Chronic pain    Pain Onset  More than a month ago                       [facility] Adult PT Treatment/Exercise - 09/08/19 0001      Exercises   Exercises  Shoulder      Shoulder Exercises: ROM/Strengthening   UBE (Upper Arm Bike)  90 RPM's x 6 minutes.      Modalities   Modalities  Electrical Stimulation;Ultrasound      Acupuncturist Location  Right shoulder.    Electrical Stimulation Action  IFC    Electrical Stimulation Parameters  80-150 hz x 20 minutes.    Electrical Stimulation Goals  Pain      Ultrasound   Ultrasound Location  Right ant/post shld and deltoid.    Ultrasound Parameters  Combo e'stim/U/S at 1.50 W/CM2 x 12 minutes.      Manual Therapy   Manual Therapy  Soft tissue mobilization    Soft tissue mobilization  STW/M x 14 minutes to patient's right shoulder.                  PT Long Term Goals - 08/30/19 1958      PT LONG TERM GOAL #1   Title  Patient will be independent with HEP.    Time  6    Period  Weeks    Status  New      PT LONG TERM GOAL #2   Title  Patient will demonstrate 150+ degrees of left shoulder AROM to improve ability to perform overhead tasks.  Time  6    Period  Weeks    Status  New      PT LONG TERM GOAL #3   Title  Patient will demonstrate 60+ degrees of right shoulder ER AROM to improve ability to don/doff apparel.    Time  6    Period  Weeks    Status  New      PT LONG TERM GOAL #4   Title  Patient will demonstrate behind the back IR to L4 or higher for dressing activities.    Period  Weeks    Status  New      PT LONG TERM GOAL #5   Title  Patient will report ability to perform ADLs and work activities with right shoulder pain less than or equal to 3/10.    Time  6    Period  Weeks    Status  New            Plan - 09/08/19 1706    Clinical Impression Statement  Patient did well with treatment today and by the conclusion of STW/M he reports his shoulder was no longer aching.    Personal Factors and Comorbidities  Comorbidity 1    Comorbidities  HTN    Examination-Activity Limitations  Carry;Reach Overhead;Hygiene/Grooming;Dressing;Bathing    Examination-Participation Restrictions  Cleaning    Stability/Clinical Decision Making  Stable/Uncomplicated    Rehab Potential  Good    PT Frequency  2x / week    PT Duration   6 weeks    PT Treatment/Interventions  ADLs/Self Care Home Management;Cryotherapy;Electrical Stimulation;Ultrasound;Moist Heat;Iontophoresis 4mg /ml Dexamethasone;Neuromuscular re-education;Manual techniques;Patient/family education;Therapeutic activities;Therapeutic exercise;Passive range of motion;Vasopneumatic Device;Taping    PT Next Visit Plan  Continue ROM with modalities PRN per POC vaso and E-stim    PT Home Exercise Plan  see patient education section    Consulted and Agree with Plan of Care  Patient       Patient will benefit from skilled therapeutic intervention in order to improve the following deficits and impairments:  Decreased activity tolerance, Decreased balance, Decreased strength, Decreased range of motion, Impaired UE functional use, Pain, Postural dysfunction  Visit Diagnosis: Chronic right shoulder pain  Stiffness of right shoulder, not elsewhere classified  Abnormal posture     Problem List Patient Active Problem List   Diagnosis Date Noted  . Umbilical hernia with obstruction, without gangrene 10/16/2017  . Angioedema 03/19/2016  . Obese 08/19/2013  . Hypertension   . Colon polyp   . Allergy   . Vitamin D deficiency   . Osteoarthritis   . Erectile dysfunction   . Hyperlipidemia     Gregory Evans, [name] 09/08/2019, 5:19 PM  [facility] [address] [place], [place], [address] Phone: [phone]   Fax:  [phone]  Name: Gregory Evans MRN: [id number] Date of Birth: [birth year]

## 2019-09-13 ENCOUNTER — Ambulatory Visit: Payer: BC Managed Care – PPO | Admitting: *Deleted

## 2019-09-13 ENCOUNTER — Other Ambulatory Visit: Payer: Self-pay

## 2019-09-13 DIAGNOSIS — R293 Abnormal posture: Secondary | ICD-10-CM | POA: Diagnosis not present

## 2019-09-13 DIAGNOSIS — G8929 Other chronic pain: Secondary | ICD-10-CM

## 2019-09-13 DIAGNOSIS — M25611 Stiffness of right shoulder, not elsewhere classified: Secondary | ICD-10-CM | POA: Diagnosis not present

## 2019-09-13 DIAGNOSIS — M25511 Pain in right shoulder: Secondary | ICD-10-CM | POA: Diagnosis not present

## 2019-09-13 NOTE — Therapy (Signed)
[facility] [place], [place], [address] Phone: [phone]   Fax:  [phone]  Physical Therapy Treatment  Patient Details  Name: Gregory Evans MRN: [id number] Date of Birth: [birth year] Referring Provider (PT): Claretta Fraise, MD   Encounter Date: 09/13/2019  PT End of Session - 09/13/19 1609    Visit Number  5    Number of Visits  12    Date for PT Re-Evaluation  10/18/19    Authorization Type  FOTO; progress note every 10th visit    PT Start Time  1600    PT Stop Time  1650    PT Time Calculation (min)  50 min       Past Medical History:  Diagnosis Date  . Allergy   . Colon polyp 2008  . Erectile dysfunction   . Hyperlipidemia   . Hypertension   . Osteoarthritis   . Vitamin D deficiency     No past surgical history on file.  There were no vitals filed for this visit.  Subjective Assessment - 09/13/19 1608    Subjective  COVID-19 screen performed prior to patient entering clinic.   Did ok after last Rx . 2/10    Limitations  Lifting;House hold activities    Diagnostic tests  x-ray: "inflammation"    Patient Stated Goals  improve movement and return to 100% mobility    Currently in Pain?  Yes    Pain Score  2     Pain Location  Shoulder    Pain Orientation  Right    Pain Type  Chronic pain                       [facility] Adult PT Treatment/Exercise - 09/13/19 0001      Exercises   Exercises  Shoulder      Shoulder Exercises: ROM/Strengthening   UBE (Upper Arm Bike)  60 RPM's x 6 minutes.      Modalities   Modalities  Electrical Stimulation;Ultrasound      Acupuncturist Location  Right shoulder.    Electrical Stimulation Action  IFC    Electrical Stimulation Parameters  80-150hz  x 15 mins    Electrical Stimulation Goals  Pain      Ultrasound   Ultrasound Location  RT ant/ post shldr and deltoid     Ultrasound Parameters  Combo estim x 12 mins 1.5  w/cm2      Manual Therapy   Manual Therapy  Soft tissue mobilization    Soft tissue mobilization  STW/M x 13 minutes to patient's right shoulder.                  PT Long Term Goals - 08/30/19 1958      PT LONG TERM GOAL #1   Title  Patient will be independent with HEP.    Time  6    Period  Weeks    Status  New      PT LONG TERM GOAL #2   Title  Patient will demonstrate 150+ degrees of left shoulder AROM to improve ability to perform overhead tasks.    Time  6    Period  Weeks    Status  New      PT LONG TERM GOAL #3   Title  Patient will demonstrate 60+ degrees of right shoulder ER AROM to improve ability to don/doff apparel.    Time  6  Period  Weeks    Status  New      PT LONG TERM GOAL #4   Title  Patient will demonstrate behind the back IR to L4 or higher for dressing activities.    Period  Weeks    Status  New      PT LONG TERM GOAL #5   Title  Patient will report ability to perform ADLs and work activities with right shoulder pain less than or equal to 3/10.    Time  6    Period  Weeks    Status  New            Plan - 09/13/19 1703    Clinical Impression Statement  Pt arrived today doing better after last Rx with decreased pain. He was able to perform HBB  easier today and with less pain donning his belt. Elevation was also better with decreased pain. Pt did well with Rx with normal modality response.    Comorbidities  HTN    Examination-Activity Limitations  Carry;Reach Overhead;Hygiene/Grooming;Dressing;Bathing    Stability/Clinical Decision Making  Stable/Uncomplicated    Rehab Potential  Good    PT Frequency  2x / week    PT Duration  6 weeks    PT Treatment/Interventions  ADLs/Self Care Home Management;Cryotherapy;Electrical Stimulation;Ultrasound;Moist Heat;Iontophoresis 4mg /ml Dexamethasone;Neuromuscular re-education;Manual techniques;Patient/family education;Therapeutic activities;Therapeutic exercise;Passive range of  motion;Vasopneumatic Device;Taping    PT Next Visit Plan  Continue ROM with modalities PRN per POC vaso and E-stim    PT Home Exercise Plan  see patient education section       Patient will benefit from skilled therapeutic intervention in order to improve the following deficits and impairments:  Decreased activity tolerance, Decreased balance, Decreased strength, Decreased range of motion, Impaired UE functional use, Pain, Postural dysfunction  Visit Diagnosis: Chronic right shoulder pain  Stiffness of right shoulder, not elsewhere classified  Abnormal posture     Problem List Patient Active Problem List   Diagnosis Date Noted  . Umbilical hernia with obstruction, without gangrene 10/16/2017  . Angioedema 03/19/2016  . Obese 08/19/2013  . Hypertension   . Colon polyp   . Allergy   . Vitamin D deficiency   . Osteoarthritis   . Erectile dysfunction   . Hyperlipidemia     Gregory Evans,CHRIS, PTA 09/13/2019, 5:10 PM  [facility] [address] [place], [place], [address] Phone: [phone]   Fax:  [phone]  Name: Gregory Evans MRN: [id number] Date of Birth: Sep 06, 1956

## 2019-09-15 ENCOUNTER — Other Ambulatory Visit: Payer: Self-pay

## 2019-09-15 ENCOUNTER — Encounter: Payer: Self-pay | Admitting: Physical Therapy

## 2019-09-15 ENCOUNTER — Ambulatory Visit: Payer: BC Managed Care – PPO | Admitting: Physical Therapy

## 2019-09-15 DIAGNOSIS — M25511 Pain in right shoulder: Secondary | ICD-10-CM | POA: Diagnosis not present

## 2019-09-15 DIAGNOSIS — G8929 Other chronic pain: Secondary | ICD-10-CM

## 2019-09-15 DIAGNOSIS — R293 Abnormal posture: Secondary | ICD-10-CM | POA: Diagnosis not present

## 2019-09-15 DIAGNOSIS — M25611 Stiffness of right shoulder, not elsewhere classified: Secondary | ICD-10-CM | POA: Diagnosis not present

## 2019-09-15 NOTE — Therapy (Signed)
[facility] [place], [place], [address] Phone: [phone]   Fax:  [phone]  Physical Therapy Treatment  Patient Details  Name: Gregory Evans MRN: [id number] Date of Birth: 1956/02/09 Referring Provider (PT): Claretta Fraise, MD   Encounter Date: 09/15/2019  PT End of Session - 09/15/19 1654    Visit Number  6    Number of Visits  12    Date for PT Re-Evaluation  10/18/19    Authorization Type  FOTO; progress note every 10th visit    PT Start Time  1557    PT Stop Time  1653    PT Time Calculation (min)  56 min    Activity Tolerance  Patient tolerated treatment well    Behavior During Therapy  [facility] for tasks assessed/performed       Past Medical History:  Diagnosis Date  . Allergy   . Colon polyp 2008  . Erectile dysfunction   . Hyperlipidemia   . Hypertension   . Osteoarthritis   . Vitamin D deficiency     History reviewed. No pertinent surgical history.  There were no vitals filed for this visit.  Subjective Assessment - 09/15/19 1559    Subjective  COVID-19 screen performed prior to patient entering clinic.  Patient reports no pain today in the shoulder and feels like combo us/e-stim is helping and requested it today.    Pertinent History  HTN    Limitations  Lifting;House hold activities    Diagnostic tests  x-ray: "inflammation"    Patient Stated Goals  improve movement and return to 100% mobility    Currently in Pain?  No/denies         [facility] PT Assessment - 09/15/19 0001      Assessment   Medical Diagnosis  Chronic shoulder pain    Referring Provider (PT)  Claretta Fraise, MD    Hand Dominance  Right    Next MD Visit  March 2021    Prior Therapy  no      AROM   Right Shoulder Flexion  135 Degrees    Right Shoulder External Rotation  48 Degrees                   [facility] Adult PT Treatment/Exercise - 09/15/19 0001      Exercises   Exercises  Shoulder      Shoulder Exercises:  Standing   Protraction  Strengthening;Right;20 reps;Theraband    Theraband Level (Shoulder Protraction)  Level 2 (Red)    External Rotation  Strengthening;Right;20 reps;Theraband    Theraband Level (Shoulder External Rotation)  Level 2 (Red)    Internal Rotation  Strengthening;Right;20 reps;Theraband    Theraband Level (Shoulder Internal Rotation)  Level 2 (Red)    Row  Strengthening;Right;20 reps;Theraband    Theraband Level (Shoulder Row)  Level 2 (Red)      Shoulder Exercises: ROM/Strengthening   UBE (Upper Arm Bike)  90 RPM's x 8 minutes.      Modalities   Modalities  Electrical Stimulation;Ultrasound      Acupuncturist Location  Right shoulder.    Electrical Stimulation Action  IFC    Electrical Stimulation Parameters  80-150 hz x15    Electrical Stimulation Goals  Pain      Ultrasound   Ultrasound Location  right ant/post shoulder    Ultrasound Parameters  combo e-stim/us 100% 1.5 w/cm2 1 mhz x12 mins    Ultrasound Goals  Pain  Vasopneumatic   Number Minutes Vasopneumatic   15 minutes    Vasopnuematic Location   Shoulder    Vasopneumatic Pressure  Low    Vasopneumatic Temperature   50      Manual Therapy   Manual Therapy  Soft tissue mobilization    Soft tissue mobilization  STW/M to anterior and lateral shoulder to decrease pain and reduce tension                  PT Long Term Goals - 09/15/19 1647      PT LONG TERM GOAL #1   Title  Patient will be independent with HEP.    Time  6    Period  Weeks    Status  Achieved      PT LONG TERM GOAL #2   Title  Patient will demonstrate 150+ degrees of left shoulder AROM to improve ability to perform overhead tasks.    Time  6    Period  Weeks    Status  On-going   138     PT LONG TERM GOAL #3   Title  Patient will demonstrate 60+ degrees of right shoulder ER AROM to improve ability to don/doff apparel.    Time  6    Period  Weeks    Status  On-going   48     PT  LONG TERM GOAL #4   Title  Patient will demonstrate behind the back IR to L4 or higher for dressing activities.    Time  6    Period  Weeks    Status  On-going      PT LONG TERM GOAL #5   Title  Patient will report ability to perform ADLs and work activities with right shoulder pain less than or equal to 3/10.    Time  6    Period  Weeks    Status  Achieved            Plan - 09/15/19 1655    Clinical Impression Statement  Patient responded well to therapy session minimal reports of pain. Patient reports functional improvements particularly behind the back motions for lower body dressing. Goals ongoing at this time. Normal response to modalities upon removal.    Personal Factors and Comorbidities  Comorbidity 1    Comorbidities  HTN    Examination-Activity Limitations  Carry;Reach Overhead;Hygiene/Grooming;Dressing;Bathing    Examination-Participation Restrictions  Cleaning    Stability/Clinical Decision Making  Stable/Uncomplicated    Clinical Decision Making  Low    Rehab Potential  Good    PT Frequency  2x / week    PT Duration  6 weeks    PT Treatment/Interventions  ADLs/Self Care Home Management;Cryotherapy;Electrical Stimulation;Ultrasound;Moist Heat;Iontophoresis 4mg /ml Dexamethasone;Neuromuscular re-education;Manual techniques;Patient/family education;Therapeutic activities;Therapeutic exercise;Passive range of motion;Vasopneumatic Device;Taping    PT Next Visit Plan  FOTO next visit; cont. with ROM, gentle strengthening and modalities for pain relief    Consulted and Agree with Plan of Care  Patient       Patient will benefit from skilled therapeutic intervention in order to improve the following deficits and impairments:  Decreased activity tolerance, Decreased balance, Decreased strength, Decreased range of motion, Impaired UE functional use, Pain, Postural dysfunction  Visit Diagnosis: Chronic right shoulder pain  Stiffness of right shoulder, not elsewhere  classified  Abnormal posture     Problem List Patient Active Problem List   Diagnosis Date Noted  . Umbilical hernia with obstruction, without gangrene 10/16/2017  . Angioedema 03/19/2016  .  Obese 08/19/2013  . Hypertension   . Colon polyp   . Allergy   . Vitamin D deficiency   . Osteoarthritis   . Erectile dysfunction   . Hyperlipidemia     Gregory Evans, PT, DPT 09/15/2019, 4:59 PM  [facility] [address] [place], [place], [address] Phone: [phone]   Fax:  [phone]  Name: Kealon Boor MRN: [id number] Date of Birth: 1956/08/11

## 2019-09-20 ENCOUNTER — Other Ambulatory Visit: Payer: Self-pay

## 2019-09-20 ENCOUNTER — Ambulatory Visit: Payer: BC Managed Care – PPO | Attending: Family Medicine | Admitting: Physical Therapy

## 2019-09-20 DIAGNOSIS — G8929 Other chronic pain: Secondary | ICD-10-CM | POA: Insufficient documentation

## 2019-09-20 DIAGNOSIS — R293 Abnormal posture: Secondary | ICD-10-CM | POA: Diagnosis not present

## 2019-09-20 DIAGNOSIS — M25511 Pain in right shoulder: Secondary | ICD-10-CM | POA: Diagnosis not present

## 2019-09-20 DIAGNOSIS — M25611 Stiffness of right shoulder, not elsewhere classified: Secondary | ICD-10-CM | POA: Diagnosis not present

## 2019-09-20 NOTE — Therapy (Signed)
[facility] [place], [place], [address] Phone: [phone]   Fax:  [phone]  Physical Therapy Treatment  Patient Details  Name: Gregory Evans MRN: [id number] Date of Birth: Mar 25, 1956 Referring Provider (PT): Claretta Fraise, MD   Encounter Date: 09/20/2019  PT End of Session - 09/20/19 1640    Visit Number  7    Number of Visits  12    Date for PT Re-Evaluation  10/18/19    Authorization Type  FOTO; progress note every 10th visit    PT Start Time  0400    PT Stop Time  0452    PT Time Calculation (min)  52 min       Past Medical History:  Diagnosis Date  . Allergy   . Colon polyp 2008  . Erectile dysfunction   . Hyperlipidemia   . Hypertension   . Osteoarthritis   . Vitamin D deficiency     No past surgical history on file.  There were no vitals filed for this visit.  Subjective Assessment - 09/20/19 1634    Subjective  COVID-19 screen performed prior to patient entering clinic.  Pain about a 2/10.  Pain increases with movement.    Pertinent History  HTN    Limitations  Lifting;House hold activities    Diagnostic tests  x-ray: "inflammation"    Patient Stated Goals  improve movement and return to 100% mobility    Currently in Pain?  Yes    Pain Score  2     Pain Orientation  Right    Pain Descriptors / Indicators  Discomfort;Aching    Pain Onset  More than a month ago                       [facility] Adult PT Treatment/Exercise - 09/20/19 0001      Exercises   Exercises  Shoulder      Shoulder Exercises: ROM/Strengthening   UBE (Upper Arm Bike)  90 RPM's x 8 minutes.      Modalities   Modalities  Electrical Stimulation;Moist Heat;Ultrasound      Acupuncturist Location  RT shoulder.    Electrical Stimulation Action  IFC    Electrical Stimulation Parameters  80-150 hz x 20 minutes.    Electrical Stimulation Goals  Pain      Ultrasound   Ultrasound  Location  RT shoulder.    Ultrasound Parameters  Combo e'stim/U/S at 1.50 W/CM2 x 8 minutes.    Ultrasound Goals  Pain      Manual Therapy   Manual Therapy  Soft tissue mobilization    Soft tissue mobilization  IASTM x 7 minutes to right shoulder musculature.                  PT Long Term Goals - 09/15/19 1647      PT LONG TERM GOAL #1   Title  Patient will be independent with HEP.    Time  6    Period  Weeks    Status  Achieved      PT LONG TERM GOAL #2   Title  Patient will demonstrate 150+ degrees of left shoulder AROM to improve ability to perform overhead tasks.    Time  6    Period  Weeks    Status  On-going   138     PT LONG TERM GOAL #3   Title  Patient will demonstrate 60+ degrees  of right shoulder ER AROM to improve ability to don/doff apparel.    Time  6    Period  Weeks    Status  On-going   48     PT LONG TERM GOAL #4   Title  Patient will demonstrate behind the back IR to L4 or higher for dressing activities.    Time  6    Period  Weeks    Status  On-going      PT LONG TERM GOAL #5   Title  Patient will report ability to perform ADLs and work activities with right shoulder pain less than or equal to 3/10.    Time  6    Period  Weeks    Status  Achieved            Plan - 09/20/19 1641    Clinical Impression Statement  patient doing well with treatments and has been reporting a consistently lowerd pain-level.  He states he is doing his home exercise program.    Personal Factors and Comorbidities  Comorbidity 1    Comorbidities  HTN    Examination-Activity Limitations  Carry;Reach Overhead;Hygiene/Grooming;Dressing;Bathing    Stability/Clinical Decision Making  Stable/Uncomplicated    Rehab Potential  Good    PT Frequency  2x / week    PT Duration  6 weeks    PT Treatment/Interventions  ADLs/Self Care Home Management;Cryotherapy;Electrical Stimulation;Ultrasound;Moist Heat;Iontophoresis 4mg /ml Dexamethasone;Neuromuscular  re-education;Manual techniques;Patient/family education;Therapeutic activities;Therapeutic exercise;Passive range of motion;Vasopneumatic Device;Taping    PT Next Visit Plan  FOTO next visit; cont. with ROM, gentle strengthening and modalities for pain relief    PT Home Exercise Plan  see patient education section    Consulted and Agree with Plan of Care  Patient       Patient will benefit from skilled therapeutic intervention in order to improve the following deficits and impairments:  Decreased activity tolerance, Decreased balance, Decreased strength, Decreased range of motion, Impaired UE functional use, Pain, Postural dysfunction  Visit Diagnosis: Chronic right shoulder pain  Stiffness of right shoulder, not elsewhere classified  Abnormal posture     Problem List Patient Active Problem List   Diagnosis Date Noted  . Umbilical hernia with obstruction, without gangrene 10/16/2017  . Angioedema 03/19/2016  . Obese 08/19/2013  . Hypertension   . Colon polyp   . Allergy   . Vitamin D deficiency   . Osteoarthritis   . Erectile dysfunction   . Hyperlipidemia     Fredda Clarida, [name] 09/20/2019, 4:53 PM  [facility] [address] [place], [place], [address] Phone: [phone]   Fax:  [phone]  Name: Gregory Evans MRN: [id number] Date of Birth: 1956-08-02

## 2019-09-22 ENCOUNTER — Ambulatory Visit: Payer: BC Managed Care – PPO | Admitting: *Deleted

## 2019-09-22 DIAGNOSIS — M25511 Pain in right shoulder: Secondary | ICD-10-CM | POA: Diagnosis not present

## 2019-09-22 DIAGNOSIS — R293 Abnormal posture: Secondary | ICD-10-CM

## 2019-09-22 DIAGNOSIS — G8929 Other chronic pain: Secondary | ICD-10-CM

## 2019-09-22 DIAGNOSIS — M25611 Stiffness of right shoulder, not elsewhere classified: Secondary | ICD-10-CM | POA: Diagnosis not present

## 2019-09-22 NOTE — Therapy (Signed)
[facility] [place], [place], [address] Phone: [phone]   Fax:  [phone]  Physical Therapy Treatment  Patient Details  Name: Gregory Evans MRN: [id number] Date of Birth: May 30, 1956 Referring Provider (PT): Claretta Fraise, MD   Encounter Date: 09/22/2019  PT End of Session - 09/22/19 1644    Visit Number  8    Number of Visits  12    Date for PT Re-Evaluation  10/18/19    Authorization Type  FOTO; progress note every 10th visit    PT Start Time  1600    PT Stop Time  1650    PT Time Calculation (min)  50 min       Past Medical History:  Diagnosis Date  . Allergy   . Colon polyp 2008  . Erectile dysfunction   . Hyperlipidemia   . Hypertension   . Osteoarthritis   . Vitamin D deficiency     No past surgical history on file.  There were no vitals filed for this visit.  Subjective Assessment - 09/22/19 1649    Subjective  COVID-19 screen performed prior to patient entering clinic.  Pain about a 2/10. RT shldr hurts with certain mvmnts, but better overall    Pertinent History  HTN    Limitations  Lifting;House hold activities    Diagnostic tests  x-ray: "inflammation"    Patient Stated Goals  improve movement and return to 100% mobility    Currently in Pain?  Yes    Pain Score  2     Pain Location  Shoulder    Pain Orientation  Right    Pain Descriptors / Indicators  Aching;Discomfort    Pain Type  Chronic pain    Pain Onset  More than a month ago                       [facility] Adult PT Treatment/Exercise - 09/22/19 0001      Exercises   Exercises  Shoulder      Shoulder Exercises: ROM/Strengthening   UBE (Upper Arm Bike)  90 RPM's x 8 minutes.      Modalities   Modalities  Electrical Stimulation;Moist Heat;Ultrasound      Moist Heat Therapy   Number Minutes Moist Heat  15 Minutes    Moist Heat Location  Shoulder      Electrical Stimulation   Electrical Stimulation Location  RT  shoulder.    Electrical Stimulation Action  IFC    Electrical Stimulation Parameters  80-150hz  x 15 mins    Electrical Stimulation Goals  Pain      Ultrasound   Ultrasound Location  RT shldr    Ultrasound Parameters  Combo estim/ combo x 10 mins    Ultrasound Goals  Pain      Manual Therapy   Manual Therapy  Soft tissue mobilization    Soft tissue mobilization  IASTM x 8 minutes to right shoulder musculature.                  PT Long Term Goals - 09/15/19 1647      PT LONG TERM GOAL #1   Title  Patient will be independent with HEP.    Time  6    Period  Weeks    Status  Achieved      PT LONG TERM GOAL #2   Title  Patient will demonstrate 150+ degrees of left shoulder AROM to improve ability to perform  overhead tasks.    Time  6    Period  Weeks    Status  On-going   138     PT LONG TERM GOAL #3   Title  Patient will demonstrate 60+ degrees of right shoulder ER AROM to improve ability to don/doff apparel.    Time  6    Period  Weeks    Status  On-going   48     PT LONG TERM GOAL #4   Title  Patient will demonstrate behind the back IR to L4 or higher for dressing activities.    Time  6    Period  Weeks    Status  On-going      PT LONG TERM GOAL #5   Title  Patient will report ability to perform ADLs and work activities with right shoulder pain less than or equal to 3/10.    Time  6    Period  Weeks    Status  Achieved            Plan - 09/22/19 1654    Clinical Impression Statement  Pt arrived today doing fairly well with 2/10 pain LT shldr. He reports that his pain is lower now, but increases with certain movements. His AROM for standing elevation is 130 degrees today.Normal modality response today    Personal Factors and Comorbidities  Comorbidity 1    Comorbidities  HTN    Examination-Activity Limitations  Carry;Reach Overhead;Hygiene/Grooming;Dressing;Bathing    Stability/Clinical Decision Making  Stable/Uncomplicated    Rehab Potential   Good    PT Frequency  2x / week    PT Duration  6 weeks    PT Treatment/Interventions  ADLs/Self Care Home Management;Cryotherapy;Electrical Stimulation;Ultrasound;Moist Heat;Iontophoresis 4mg /ml Dexamethasone;Neuromuscular re-education;Manual techniques;Patient/family education;Therapeutic activities;Therapeutic exercise;Passive range of motion;Vasopneumatic Device;Taping    PT Next Visit Plan  FOTO next visit; cont. with ROM, gentle strengthening and modalities for pain relief    PT Home Exercise Plan  see patient education section    Consulted and Agree with Plan of Care  Patient       Patient will benefit from skilled therapeutic intervention in order to improve the following deficits and impairments:  Decreased activity tolerance, Decreased balance, Decreased strength, Decreased range of motion, Impaired UE functional use, Pain, Postural dysfunction  Visit Diagnosis: Chronic right shoulder pain  Stiffness of right shoulder, not elsewhere classified  Abnormal posture     Problem List Patient Active Problem List   Diagnosis Date Noted  . Umbilical hernia with obstruction, without gangrene 10/16/2017  . Angioedema 03/19/2016  . Obese 08/19/2013  . Hypertension   . Colon polyp   . Allergy   . Vitamin D deficiency   . Osteoarthritis   . Erectile dysfunction   . Hyperlipidemia     ,CHRIS, PTA 09/22/2019, 5:02 PM  [facility] [address] [place], [place], [address] Phone: [phone]   Fax:  [phone]  Name: Gregory Evans MRN: [id number] Date of Birth: [birth year]

## 2019-09-27 ENCOUNTER — Other Ambulatory Visit: Payer: Self-pay

## 2019-09-27 ENCOUNTER — Ambulatory Visit: Payer: BC Managed Care – PPO | Admitting: *Deleted

## 2019-09-27 DIAGNOSIS — G8929 Other chronic pain: Secondary | ICD-10-CM

## 2019-09-27 DIAGNOSIS — R293 Abnormal posture: Secondary | ICD-10-CM

## 2019-09-27 DIAGNOSIS — M25511 Pain in right shoulder: Secondary | ICD-10-CM | POA: Diagnosis not present

## 2019-09-27 DIAGNOSIS — M25611 Stiffness of right shoulder, not elsewhere classified: Secondary | ICD-10-CM

## 2019-09-27 NOTE — Therapy (Signed)
[facility] [place], [place], [address] Phone: [phone]   Fax:  [phone]  Physical Therapy Treatment  Patient Details  Name: Gregory Evans MRN: [id number] Date of Birth: [birth year] Referring Provider (PT): Claretta Fraise, MD   Encounter Date: 09/27/2019  PT End of Session - 09/27/19 1603    Visit Number  9    Number of Visits  12    Date for PT Re-Evaluation  10/18/19    Authorization Type  FOTO; progress note every 10th visit    PT Start Time  1600    PT Stop Time  1649    PT Time Calculation (min)  49 min       Past Medical History:  Diagnosis Date  . Allergy   . Colon polyp 2008  . Erectile dysfunction   . Hyperlipidemia   . Hypertension   . Osteoarthritis   . Vitamin D deficiency     No past surgical history on file.  There were no vitals filed for this visit.  Subjective Assessment - 09/27/19 1603    Subjective  COVID-19 screen performed prior to patient entering clinic.  Pain about a 2/10. RT shldr hurts with certain mvmnts, but better overall still    Pertinent History  HTN    Limitations  Lifting;House hold activities    Diagnostic tests  x-ray: "inflammation"    Patient Stated Goals  improve movement and return to 100% mobility    Currently in Pain?  Yes    Pain Score  2     Pain Location  Shoulder    Pain Orientation  Right    Pain Descriptors / Indicators  Aching;Discomfort    Pain Type  Chronic pain    Pain Onset  More than a month ago    Pain Frequency  Intermittent                       [facility] Adult PT Treatment/Exercise - 09/27/19 0001      Exercises   Exercises  Shoulder      Shoulder Exercises: ROM/Strengthening   UBE (Upper Arm Bike)  90 RPM's x 8 minutes.      Modalities   Modalities  Electrical Stimulation;Moist Heat;Ultrasound      Moist Heat Therapy   Number Minutes Moist Heat  15 Minutes    Moist Heat Location  Shoulder      Electrical Stimulation    Electrical Stimulation Location  RT shoulder.    Electrical Stimulation Action  IFC    Electrical Stimulation Parameters  80-150hz  x 15 mins    Electrical Stimulation Goals  Pain      Ultrasound   Ultrasound Location  RT shldr    Ultrasound Parameters  Combo estim/US  x 10 mins 1.5 w/cm2     Ultrasound Goals  Pain      Manual Therapy   Manual Therapy  Soft tissue mobilization    Soft tissue mobilization  IASTM /STWx 8 minutes to right shoulder musculature.                  PT Long Term Goals - 09/15/19 1647      PT LONG TERM GOAL #1   Title  Patient will be independent with HEP.    Time  6    Period  Weeks    Status  Achieved      PT LONG TERM GOAL #2   Title  Patient will demonstrate  150+ degrees of left shoulder AROM to improve ability to perform overhead tasks.    Time  6    Period  Weeks    Status  On-going   138     PT LONG TERM GOAL #3   Title  Patient will demonstrate 60+ degrees of right shoulder ER AROM to improve ability to don/doff apparel.    Time  6    Period  Weeks    Status  On-going   48     PT LONG TERM GOAL #4   Title  Patient will demonstrate behind the back IR to L4 or higher for dressing activities.    Time  6    Period  Weeks    Status  On-going      PT LONG TERM GOAL #5   Title  Patient will report ability to perform ADLs and work activities with right shoulder pain less than or equal to 3/10.    Time  6    Period  Weeks    Status  Achieved            Plan - 09/27/19 1641    Clinical Impression Statement  Pt arrived today doing fairly well with RT shldr pain 2/10. 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 As long as he modifies his technique the pain is less. His AROM for elevation still at 130 degrees and HBB IR to L4-5. Normal modality response today    Comorbidities  HTN    Examination-Activity Limitations  Carry;Reach Overhead;Hygiene/Grooming;Dressing;Bathing    Stability/Clinical  Decision Making  Stable/Uncomplicated    Rehab Potential  Good    PT Frequency  2x / week    PT Duration  6 weeks    PT Treatment/Interventions  ADLs/Self Care Home Management;Cryotherapy;Electrical Stimulation;Ultrasound;Moist Heat;Iontophoresis 4mg /ml Dexamethasone;Neuromuscular re-education;Manual techniques;Patient/family education;Therapeutic activities;Therapeutic exercise;Passive range of motion;Vasopneumatic Device;Taping    PT Next Visit Plan  FOTO next visit; cont. with ROM, gentle strengthening and modalities for pain relief    PT Home Exercise Plan  see patient education section       Patient will benefit from skilled therapeutic intervention in order to improve the following deficits and impairments:  Decreased activity tolerance, Decreased balance, Decreased strength, Decreased range of motion, Impaired UE functional use, Pain, Postural dysfunction  Visit Diagnosis: Chronic right shoulder pain  Stiffness of right shoulder, not elsewhere classified  Abnormal posture     Problem List Patient Active Problem List   Diagnosis Date Noted  . Umbilical hernia with obstruction, without gangrene 10/16/2017  . Angioedema 03/19/2016  . Obese 08/19/2013  . Hypertension   . Colon polyp   . Allergy   . Vitamin D deficiency   . Osteoarthritis   . Erectile dysfunction   . Hyperlipidemia     ,CHRIS , PTA 09/27/2019, 4:49 PM  [facility] [address] [place], [place], [address] Phone: [phone]   Fax:  [phone]  Name: Gregory Evans MRN: [id number] Date of Birth: 1956/07/23

## 2019-09-28 NOTE — Progress Notes (Signed)
Referring Provider: Claretta Fraise, MD Primary Care Physician:  Claretta Fraise, MD Primary Gastroenterologist:  Dr. Gala Romney  Chief Complaint  Patient presents with  . Consult    TCS done 8 yrs ago    HPI:   Gregory Evans is a 63 y.o. male presenting today at the request of Claretta Fraise, MD for colon cancer screening.  He was brought into the office to schedule colonoscopy due to alcohol use. Past medical history significant for HTN, HLD, chronic right shoulder pain, and ?colon polyp.   Today he states he has no concerns. When getting up in the morning and drinking something, he will have abdominal pain. May last a few hours. More of an aching. Occurred a couple months ago for about 2 months. Would drink a ginger tea or take antacid, the pain would resolve. Coffee would trigger pain. Water caused no trouble. No foods caused pain. Would have reflux with this as well. Reflux has resolved. Has not been present for the last couple of weeks. BMs daily. No constipation or diarrhea. No brbpr or melena. No dysphagia. No nausea or vomiting. No unintentional weight loss.   No ibuprofen, Aleve, Advil, or Goody powders. Does take Diclofenac BID x 1 year.   Last TCS: Patient thinks last colonoscopy was 8-10 years ago.  In his history, colonoscopy was reported in 2008.  He is not sure where his colonoscopy was performed but thinks it was [place] somewhere. PCP sent him because it was time for repeat. Reports he was told he had a small polyp.   Past Medical History:  Diagnosis Date  . Allergy   . Colon polyp 2008  . Erectile dysfunction   . Hyperlipidemia   . Hypertension   . Osteoarthritis   . Vitamin D deficiency     Past Surgical History:  Procedure Laterality Date  . CYSTECTOMY     patietn reports removal of chest wall cyst as a teenager     Current Outpatient Medications  Medication Sig Dispense Refill  . amLODipine (NORVASC) 10 MG tablet TAKE 1 TABLET (10 MG TOTAL) BY MOUTH DAILY. 90  tablet 3  . aspirin EC 81 MG tablet Take 81 mg by mouth daily.    . Cholecalciferol (VITAMIN D3 PO) Take by mouth.    . diclofenac (VOLTAREN) 75 MG EC tablet Take 1 tablet (75 mg total) by mouth 2 (two) times daily. 180 tablet 1  . hydrOXYzine (VISTARIL) 25 MG capsule Take 1 capsule (25 mg total) by mouth 2 (two) times daily. 180 capsule 1  . levocetirizine (XYZAL) 5 MG tablet Take 1 tablet (5 mg total) by mouth every evening. 90 tablet 3  . metoprolol tartrate (LOPRESSOR) 100 MG tablet TAKE 1 TABLET BY MOUTH TWICE A DAY 180 tablet 0  . sildenafil (REVATIO) 20 MG tablet Take 1 tablet (20 mg total) by mouth 3 (three) times daily. (Patient taking differently: Take 20 mg by mouth 3 (three) times daily. As needed) 100 tablet 5  . omeprazole (PRILOSEC) 20 MG capsule Take 1 capsule (20 mg total) by mouth daily before breakfast. 90 capsule 3   No current facility-administered medications for this visit.     Allergies as of 09/29/2019 - Review Complete 09/29/2019  Allergen Reaction Noted  . Ace inhibitors  08/19/2013  . Cozaar [losartan potassium]  08/19/2013    Family History  Problem Relation Age of Onset  . Hypertension Mother   . Dementia Mother   . Diabetes Father 28  . Colon cancer  Neg Hx     Social History   Socioeconomic History  . Marital status: Married    Spouse name: Not on file  . Number of children: Not on file  . Years of education: Not on file  . Highest education level: Not on file  Occupational History    Employer: [employer]  Social Needs  . Financial resource strain: Not on file  . Food insecurity    Worry: Not on file    Inability: Not on file  . Transportation needs    Medical: Not on file    Non-medical: Not on file  Tobacco Use  . Smoking status: Former Smoker    Quit date: 08/19/1993    Years since quitting: 26.1  . Smokeless tobacco: Never Used  Substance and Sexual Activity  . Alcohol use: Yes    Comment: 4 shots daily  . Drug  use: No  . Sexual activity: Not on file  Lifestyle  . Physical activity    Days per week: Not on file    Minutes per session: Not on file  . Stress: Not on file  Relationships  . Social Herbalist on phone: Not on file    Gets together: Not on file    Attends religious service: Not on file    Active member of club or organization: Not on file    Attends meetings of clubs or organizations: Not on file    Relationship status: Not on file  . Intimate partner violence    Fear of current or ex partner: Not on file    Emotionally abused: Not on file    Physically abused: Not on file    Forced sexual activity: Not on file  Other Topics Concern  . Not on file  Social History Narrative   Married    6 children   Outside pet    Review of Systems: Gen: Denies any fever, chills, CV: Denies chest pain, heart palpitations Resp: Denies shortness of breath at rest. Admits to some SOB with exertion. Occasional cough with allergies.  GI: See HPI GU : Denies urinary burning, urinary frequency, urinary hesitancy MS: Admits to right shoulder pain. Is in therapy for this.  Derm: Denies rash Psych: Admits to some depression. Wife has dementia.  Heme: Denies bruising, bleeding   Physical Exam: BP (!) 187/83   Pulse (!) 53   Temp (!) 97.1 F (36.2 C) (Oral)   Ht 5' 11.5" (1.816 m)   Wt 270 lb 3.2 oz (122.6 kg)   BMI 37.16 kg/m  General:   Alert and oriented. Pleasant and cooperative. Well-nourished and well-developed.  Head:  Normocephalic and atraumatic. Eyes:  Without icterus, sclera clear and conjunctiva pink.  Ears:  Normal auditory acuity. Lungs:  Clear to auscultation bilaterally. No wheezes, rales, or rhonchi. No distress.  Heart:  S1, S2 present without murmurs appreciated.  Abdomen:  +BS, soft, non-tender and non-distended. No HSM noted. No guarding or rebound. Soft, non-tender, reducable umbilical hernia. No masses appreciated.  Rectal:  Deferred  Msk:  Symmetrical  without gross deformities. Normal posture. Extremities:  1+ Bilateral LE edema Neurologic:  Alert and  oriented x4;  grossly normal neurologically. Skin:  Intact without significant lesions or rashes. Psych: Normal mood and affect.

## 2019-09-29 ENCOUNTER — Other Ambulatory Visit: Payer: Self-pay

## 2019-09-29 ENCOUNTER — Encounter: Payer: Self-pay | Admitting: Gastroenterology

## 2019-09-29 ENCOUNTER — Ambulatory Visit: Payer: BC Managed Care – PPO | Admitting: *Deleted

## 2019-09-29 ENCOUNTER — Ambulatory Visit (INDEPENDENT_AMBULATORY_CARE_PROVIDER_SITE_OTHER): Payer: BC Managed Care – PPO | Admitting: Gastroenterology

## 2019-09-29 VITALS — BP 187/83 | HR 53 | Temp 97.1°F | Ht 71.5 in | Wt 270.2 lb

## 2019-09-29 DIAGNOSIS — Z791 Long term (current) use of non-steroidal anti-inflammatories (NSAID): Secondary | ICD-10-CM

## 2019-09-29 DIAGNOSIS — R293 Abnormal posture: Secondary | ICD-10-CM

## 2019-09-29 DIAGNOSIS — M25511 Pain in right shoulder: Secondary | ICD-10-CM | POA: Diagnosis not present

## 2019-09-29 DIAGNOSIS — G8929 Other chronic pain: Secondary | ICD-10-CM | POA: Diagnosis not present

## 2019-09-29 DIAGNOSIS — K635 Polyp of colon: Secondary | ICD-10-CM

## 2019-09-29 DIAGNOSIS — M25611 Stiffness of right shoulder, not elsewhere classified: Secondary | ICD-10-CM

## 2019-09-29 MED ORDER — PEG 3350-KCL-NA BICARB-NACL 420 G PO SOLR: 4000.0000 mL | ORAL | 0 refills | Status: DC

## 2019-09-29 MED ORDER — OMEPRAZOLE 20 MG PO CPDR: 20.0000 mg | DELAYED_RELEASE_CAPSULE | Freq: Every day | ORAL | 3 refills | Status: DC

## 2019-09-29 NOTE — Assessment & Plan Note (Addendum)
63 year old male with past medical history of HTN, HLD, chronic right shoulder pain, and apparent polyp on last colonoscopy in 2008 who presents to schedule his surveillance colonoscopy.  Patient not sure where his last colonoscopy was but states it was somewhere in [place] and he was told he had a small polyp.  He is currently without any significant upper or lower GI complaints.  No alarm symptoms.  No family history of colon cancer.  He does drink 4 shots of liquor daily.   Proceed with TCS with propofol with Dr. Gala Romney in the near future. The risks, benefits, and alternatives have been discussed in detail with patient. They have stated understanding and desire to proceed.  Follow-up as recommended at the time of colonoscopy.

## 2019-09-29 NOTE — Patient Instructions (Signed)
We will be scheduled for a colonoscopy in the near future with Dr. Gala Romney.  I am sending in a prescription for omeprazole 20 mg to your pharmacy.  You should take this once every morning 30 minutes before breakfast.  This is to protect your stomach in the setting of daily aspirin and diclofenac use.  We will plan to see you back as recommended at the time of your procedure.  Do not hesitate to call if you have any questions or concerns.  Aliene Altes, PA-C [facility] Gastroenterology

## 2019-09-29 NOTE — Therapy (Signed)
[facility] [place], [place], [address] Phone: [phone]   Fax:  [phone]  Physical Therapy Treatment  Patient Details  Name: Gregory Evans MRN: [id number] Date of Birth: [birth year] Referring Provider (PT): Claretta Fraise, MD   Encounter Date: 09/29/2019  PT End of Session - 09/29/19 1738    Visit Number  10    Number of Visits  12    Date for PT Re-Evaluation  10/18/19    Authorization Type  FOTO; progress note every 10th visit   10th visit FOTO 30% limitation    PT Start Time  1600    PT Stop Time  1650    PT Time Calculation (min)  50 min       Past Medical History:  Diagnosis Date  . Allergy   . Colon polyp 2008  . Erectile dysfunction   . Hyperlipidemia   . Hypertension   . Osteoarthritis   . Vitamin D deficiency     Past Surgical History:  Procedure Laterality Date  . CYSTECTOMY     patietn reports removal of chest wall cyst as a teenager     There were no vitals filed for this visit.  Subjective Assessment - 09/29/19 1736    Subjective  COVID-19 screen performed prior to patient entering clinic.  Pain about a 2/10. RT shldr sore after last Rx , but feels better.    Pertinent History  HTN    Limitations  Lifting;House hold activities    Diagnostic tests  x-ray: "inflammation"    Patient Stated Goals  improve movement and return to 100% mobility    Currently in Pain?  Yes    Pain Score  2     Pain Location  Shoulder    Pain Orientation  Right    Pain Descriptors / Indicators  Aching;Discomfort    Pain Type  Chronic pain    Pain Onset  More than a month ago                       [facility] Adult PT Treatment/Exercise - 09/29/19 0001      Exercises   Exercises  Shoulder      Shoulder Exercises: Pulleys   Flexion  3 minutes      Shoulder Exercises: ROM/Strengthening   UBE (Upper Arm Bike)  90 RPM's x 8 minutes.      Modalities   Modalities  Electrical Stimulation;Moist  Heat;Ultrasound      Moist Heat Therapy   Number Minutes Moist Heat  15 Minutes    Moist Heat Location  Shoulder      Electrical Stimulation   Electrical Stimulation Location  RT shoulder.    Electrical Stimulation Action  IFC 80-150hz  x 15 mins    Electrical Stimulation Goals  Pain      Ultrasound   Ultrasound Location  RT shldr    Ultrasound Parameters  Combo 1.5 w/cm2 x 10 mins    Ultrasound Goals  Pain      Manual Therapy   Manual Therapy  Soft tissue mobilization    Soft tissue mobilization  IASTM /STWx 8 minutes to right shoulder musculature.                  PT Long Term Goals - 09/15/19 1647      PT LONG TERM GOAL #1   Title  Patient will be independent with HEP.    Time  6    Period  Weeks    Status  Achieved      PT LONG TERM GOAL #2   Title  Patient will demonstrate 150+ degrees of left shoulder AROM to improve ability to perform overhead tasks.    Time  6    Period  Weeks    Status  On-going   138     PT LONG TERM GOAL #3   Title  Patient will demonstrate 60+ degrees of right shoulder ER AROM to improve ability to don/doff apparel.    Time  6    Period  Weeks    Status  On-going   48     PT LONG TERM GOAL #4   Title  Patient will demonstrate behind the back IR to L4 or higher for dressing activities.    Time  6    Period  Weeks    Status  On-going      PT LONG TERM GOAL #5   Title  Patient will report ability to perform ADLs and work activities with right shoulder pain less than or equal to 3/10.    Time  6    Period  Weeks    Status  Achieved            Plan - 09/29/19 1741    Clinical Impression Statement  Pt arrived today doing fairly well again and reports being able to elevate his RT arm with mainly soreness and continues to get better. AROM was the same today. Normal modality response today    Comorbidities  HTN    Examination-Activity Limitations  Carry;Reach Overhead;Hygiene/Grooming;Dressing;Bathing     Examination-Participation Restrictions  Cleaning    Stability/Clinical Decision Making  Stable/Uncomplicated    Rehab Potential  Good    PT Frequency  2x / week    PT Duration  6 weeks    PT Treatment/Interventions  ADLs/Self Care Home Management;Cryotherapy;Electrical Stimulation;Ultrasound;Moist Heat;Iontophoresis 4mg /ml Dexamethasone;Neuromuscular re-education;Manual techniques;Patient/family education;Therapeutic activities;Therapeutic exercise;Passive range of motion;Vasopneumatic Device;Taping    PT Next Visit Plan  FOTO next visit; cont. with ROM, gentle strengthening and modalities for pain relief    PT Home Exercise Plan  see patient education section    Consulted and Agree with Plan of Care  Patient       Patient will benefit from skilled therapeutic intervention in order to improve the following deficits and impairments:  Decreased activity tolerance, Decreased balance, Decreased strength, Decreased range of motion, Impaired UE functional use, Pain, Postural dysfunction  Visit Diagnosis: Stiffness of right shoulder, not elsewhere classified  Abnormal posture  Chronic right shoulder pain     Problem List Patient Active Problem List   Diagnosis Date Noted  . NSAID long-term use 09/29/2019  . Umbilical hernia with obstruction, without gangrene 10/16/2017  . Angioedema 03/19/2016  . Obese 08/19/2013  . Hypertension   . Colon polyp   . Allergy   . Vitamin D deficiency   . Osteoarthritis   . Erectile dysfunction   . Hyperlipidemia     [name],CHRIS, PTA 09/29/2019, 5:49 PM  [facility] [address] [place], [place], [address] Phone: [phone]   Fax:  [phone]  Name: Gregory Evans MRN: [id number] Date of Birth: [birth year]

## 2019-09-29 NOTE — Assessment & Plan Note (Signed)
63 year old male with past medical history of HTN, HLD, and chronic right shoulder pain who is on daily aspirin and diclofenac twice daily.  He reports a 32-month span of central/upper abdominal aching in the morning after drinking coffee.  This would resolve with drinking ginger tea or taking antacid tablets.  Also with reflux symptoms at that time.  He has been without any symptoms for the last 2 weeks.  Does not take anything regularly for acid reflux.  Denies nausea, vomiting, dysphagia, unintentional weight loss, bright red blood per rectum, or melena.  He also drinks 4 shots of liquor daily.  In the setting of chronic NSAID use as well as chronic alcohol use, I feel patient should be on low-dose daily PPI to protect his stomach from gastritis and PUD. Start omeprazole 20 mg daily.

## 2019-09-29 NOTE — Progress Notes (Signed)
CC'ED TO PCP 

## 2019-10-04 ENCOUNTER — Other Ambulatory Visit: Payer: Self-pay

## 2019-10-04 ENCOUNTER — Ambulatory Visit: Payer: BC Managed Care – PPO | Admitting: *Deleted

## 2019-10-04 DIAGNOSIS — M25611 Stiffness of right shoulder, not elsewhere classified: Secondary | ICD-10-CM | POA: Diagnosis not present

## 2019-10-04 DIAGNOSIS — G8929 Other chronic pain: Secondary | ICD-10-CM

## 2019-10-04 DIAGNOSIS — R293 Abnormal posture: Secondary | ICD-10-CM

## 2019-10-04 DIAGNOSIS — M25511 Pain in right shoulder: Secondary | ICD-10-CM | POA: Diagnosis not present

## 2019-10-04 NOTE — Therapy (Signed)
[facility] [place], [place], [address] Phone: [phone]   Fax:  [phone]  Physical Therapy Treatment  Patient Details  Name: Byrle Goben MRN: [id number] Date of Birth: [birth year] Referring Provider (PT): Claretta Fraise, MD   Encounter Date: 10/04/2019  PT End of Session - 10/04/19 1603    Visit Number  11    Number of Visits  12    Date for PT Re-Evaluation  10/18/19    Authorization Type  FOTO; progress note every 10th visit   10th visit FOTO 30% limitation    PT Start Time  1600    PT Stop Time  1650    PT Time Calculation (min)  50 min       Past Medical History:  Diagnosis Date  . Allergy   . Colon polyp 2008  . Erectile dysfunction   . Hyperlipidemia   . Hypertension   . Osteoarthritis   . Vitamin D deficiency     Past Surgical History:  Procedure Laterality Date  . CYSTECTOMY     patietn reports removal of chest wall cyst as a teenager     There were no vitals filed for this visit.  Subjective Assessment - 10/04/19 1602    Subjective  COVID-19 screen performed prior to patient entering clinic.  Pain about a 2/10 today, but 5-6/10 last night for some reason    Pertinent History  HTN    Limitations  Lifting;House hold activities    Diagnostic tests  x-ray: "inflammation"    Patient Stated Goals  improve movement and return to 100% mobility    Currently in Pain?  Yes    Pain Score  2     Pain Location  Shoulder    Pain Orientation  Right    Pain Descriptors / Indicators  Aching;Discomfort    Pain Type  Chronic pain    Pain Onset  More than a month ago                       [facility] Adult PT Treatment/Exercise - 10/04/19 0001      Exercises   Exercises  Shoulder      Shoulder Exercises: Pulleys   Flexion  3 minutes      Shoulder Exercises: ROM/Strengthening   UBE (Upper Arm Bike)  90 RPM's x 8 minutes.      Modalities   Modalities  Electrical Stimulation;Moist  Heat;Ultrasound      Moist Heat Therapy   Number Minutes Moist Heat  15 Minutes    Moist Heat Location  Shoulder      Electrical Stimulation   Electrical Stimulation Location  RT shoulder.    Electrical Stimulation Action  IFC 80-150hz  x 15 mins    Electrical Stimulation Goals  Pain      Ultrasound   Ultrasound Location  RT shldr    Ultrasound Parameters  Combo 1.5 w/cm2 x 10 mins    Ultrasound Goals  Pain      Manual Therapy   Manual Therapy  Soft tissue mobilization    Soft tissue mobilization  IASTM /STWx 8 minutes to right shoulder musculature.                  PT Long Term Goals - 09/15/19 1647      PT LONG TERM GOAL #1   Title  Patient will be independent with HEP.    Time  6    Period  Weeks  Status  Achieved      PT LONG TERM GOAL #2   Title  Patient will demonstrate 150+ degrees of left shoulder AROM to improve ability to perform overhead tasks.    Time  6    Period  Weeks    Status  On-going   138     PT LONG TERM GOAL #3   Title  Patient will demonstrate 60+ degrees of right shoulder ER AROM to improve ability to don/doff apparel.    Time  6    Period  Weeks    Status  On-going   48     PT LONG TERM GOAL #4   Title  Patient will demonstrate behind the back IR to L4 or higher for dressing activities.    Time  6    Period  Weeks    Status  On-going      PT LONG TERM GOAL #5   Title  Patient will report ability to perform ADLs and work activities with right shoulder pain less than or equal to 3/10.    Time  6    Period  Weeks    Status  Achieved            Plan - 10/04/19 1650    Clinical Impression Statement  Pt arrived today still doing fairly well with RT shldr pain 2/10. He does report having 5/10 pain last night,but resolved with 2 tylenol. He was able to perform therex without increased pain RT shldr and did well with [place] and STW. Normal modality response. DC after next visit.    Examination-Activity Limitations  Carry;Reach  Overhead;Hygiene/Grooming;Dressing;Bathing    Examination-Participation Restrictions  Cleaning    Stability/Clinical Decision Making  Stable/Uncomplicated    Rehab Potential  Good    PT Frequency  2x / week    PT Duration  6 weeks    PT Treatment/Interventions  ADLs/Self Care Home Management;Cryotherapy;Electrical Stimulation;Ultrasound;Moist Heat;Iontophoresis 4mg /ml Dexamethasone;Neuromuscular re-education;Manual techniques;Patient/family education;Therapeutic activities;Therapeutic exercise;Passive range of motion;Vasopneumatic Device;Taping    PT Next Visit Plan  FOTO next visit; cont. with ROM, gentle strengthening and modalities for pain relief    PT Home Exercise Plan  see patient education section    Consulted and Agree with Plan of Care  Patient       Patient will benefit from skilled therapeutic intervention in order to improve the following deficits and impairments:     Visit Diagnosis: Abnormal posture  Chronic right shoulder pain  Stiffness of right shoulder, not elsewhere classified     Problem List Patient Active Problem List   Diagnosis Date Noted  . NSAID long-term use 09/29/2019  . Umbilical hernia with obstruction, without gangrene 10/16/2017  . Angioedema 03/19/2016  . Obese 08/19/2013  . Hypertension   . Colon polyp   . Allergy   . Vitamin D deficiency   . Osteoarthritis   . Erectile dysfunction   . Hyperlipidemia     RAMSEUR,CHRIS, PTA 10/04/2019, 4:55 PM  [facility] [place], [place], [address] Phone: [phone]   Fax:  [phone]  Name: Savir Erdmann MRN: [id number] Date of Birth: [birth year]

## 2019-10-06 ENCOUNTER — Ambulatory Visit: Payer: BC Managed Care – PPO | Admitting: *Deleted

## 2019-10-06 ENCOUNTER — Other Ambulatory Visit: Payer: Self-pay

## 2019-10-06 DIAGNOSIS — R293 Abnormal posture: Secondary | ICD-10-CM | POA: Diagnosis not present

## 2019-10-06 DIAGNOSIS — G8929 Other chronic pain: Secondary | ICD-10-CM | POA: Diagnosis not present

## 2019-10-06 DIAGNOSIS — M25611 Stiffness of right shoulder, not elsewhere classified: Secondary | ICD-10-CM

## 2019-10-06 DIAGNOSIS — M25511 Pain in right shoulder: Secondary | ICD-10-CM | POA: Diagnosis not present

## 2019-10-06 NOTE — Therapy (Addendum)
[facility] [place], [place], [address] Phone: [phone]   Fax:  [phone]  Physical Therapy Treatment PHYSICAL THERAPY DISCHARGE SUMMARY  Visits from Start of Care: 12  Current functional level related to goals / functional outcomes: See below   Remaining deficits: See goals   Education / Equipment: HEP  Plan: Patient agrees to discharge.  Patient goals were partially met. Patient is being discharged due to not returning since the last visit.  ?????     Patient Details  Name: Gregory Evans MRN: [id number] Date of Birth: 1956-01-17 Referring Provider (PT): Claretta Fraise, MD   Encounter Date: 10/06/2019  PT End of Session - 10/06/19 1615    Visit Number  12    Number of Visits  12    Date for PT Re-Evaluation  10/18/19    Authorization Type  FOTO; progress note every 10th visit   10th visit FOTO 30% limitation    PT Start Time  1600    PT Stop Time  1651    PT Time Calculation (min)  51 min       Past Medical History:  Diagnosis Date  . Allergy   . Colon polyp 2008  . Erectile dysfunction   . Hyperlipidemia   . Hypertension   . Osteoarthritis   . Vitamin D deficiency     Past Surgical History:  Procedure Laterality Date  . CYSTECTOMY     patietn reports removal of chest wall cyst as a teenager     There were no vitals filed for this visit.  Subjective Assessment - 10/06/19 1613    Subjective  COVID-19 screen performed prior to patient entering clinic.  Pain about a 2/10 today, but 5-6/10 last night for some reason, but in the back of the shldr blade    Pertinent History  HTN    Limitations  Lifting;House hold activities    Diagnostic tests  x-ray: "inflammation"    Patient Stated Goals  improve movement and return to 100% mobility    Currently in Pain?  Yes    Pain Score  3    last night 5-6/10   Pain Location  Shoulder    Pain Orientation  Right    Pain Onset  More than a month ago                        [facility] Adult PT Treatment/Exercise - 10/06/19 0001      Exercises   Exercises  Shoulder      Shoulder Exercises: Pulleys   Flexion  5 minutes      Shoulder Exercises: ROM/Strengthening   UBE (Upper Arm Bike)  90 RPM's x 8 minutes.      Modalities   Modalities  Electrical Stimulation;Moist Heat;Ultrasound      Moist Heat Therapy   Number Minutes Moist Heat  15 Minutes    Moist Heat Location  Shoulder      Electrical Stimulation   Electrical Stimulation Location  RT shoulder.    Electrical Stimulation Action  Ifc x 15 mins 80-150hz    Electrical Stimulation Goals  Pain      Ultrasound   Ultrasound Location  RT shldr/ levator scap    Ultrasound Parameters  Combo 1.5 w/cm2 x 12 mins    Ultrasound Goals  Pain      Manual Therapy   Manual Therapy  Soft tissue mobilization    Soft tissue mobilization  IASTM /STWx 8  minutes to right levator scap,  shoulder musculature.                  PT Long Term Goals - 10/06/19 1616      PT LONG TERM GOAL #1   Title  Patient will be independent with HEP.    Time  6    Period  Weeks    Status  Achieved      PT LONG TERM GOAL #2   Title  Patient will demonstrate 150+ degrees of left shoulder AROM to improve ability to perform overhead tasks.    Time  6    Period  Weeks    Status  Not Met   140 degrees     PT LONG TERM GOAL #3   Title  Patient will demonstrate 60+ degrees of right shoulder ER AROM to improve ability to don/doff apparel.    Time  6    Period  Weeks    Status  Achieved   65 degrees     PT LONG TERM GOAL #4   Title  Patient will demonstrate behind the back IR to L4 or higher for dressing activities.    Time  6    Period  Weeks    Status  Not Met   L5- S1     PT LONG TERM GOAL #5   Title  Patient will report ability to perform ADLs and work activities with right shoulder pain less than or equal to 3/10.    Time  6    Period  Weeks    Status  Achieved             Plan - 10/06/19 1703    Clinical Impression Statement  Pt arrived today doing fairly well with pain 2-3/10RT shlldr. He reports being atleast 50% better overall since starting PT.  He was able to meet ER ROM goal, but not flexion or HBB due to ROM deficits. Pt did well with Rx and will be put on Hold at this time. FOTO still 28%  limitation    Personal Factors and Comorbidities  Comorbidity 1    Examination-Activity Limitations  Carry;Reach Overhead;Hygiene/Grooming;Dressing;Bathing    Examination-Participation Restrictions  Cleaning    Stability/Clinical Decision Making  Stable/Uncomplicated    Rehab Potential  Good    PT Frequency  2x / week    PT Duration  6 weeks    PT Treatment/Interventions  ADLs/Self Care Home Management;Cryotherapy;Electrical Stimulation;Ultrasound;Moist Heat;Iontophoresis 67m/ml Dexamethasone;Neuromuscular re-education;Manual techniques;Patient/family education;Therapeutic activities;Therapeutic exercise;Passive range of motion;Vasopneumatic Device;Taping    PT Next Visit Plan  Pt wishes to be put on hold at this time  to asess RT shldr.    PT Home Exercise Plan  see patient education section    Consulted and Agree with Plan of Care  Patient       Patient will benefit from skilled therapeutic intervention in order to improve the following deficits and impairments:  Decreased activity tolerance, Decreased balance, Decreased strength, Decreased range of motion, Impaired UE functional use, Pain, Postural dysfunction  Visit Diagnosis: Abnormal posture  Chronic right shoulder pain  Stiffness of right shoulder, not elsewhere classified     Problem List Patient Active Problem List   Diagnosis Date Noted  . NSAID long-term use 09/29/2019  . Umbilical hernia with obstruction, without gangrene 10/16/2017  . Angioedema 03/19/2016  . Obese 08/19/2013  . Hypertension   . Colon polyp   . Allergy   . Vitamin D deficiency   .  Osteoarthritis   .  Erectile dysfunction   . Hyperlipidemia     Felicie Kocher,CHRIS, PTA 10/06/2019, 5:33 PM  [facility] [address] [place], [place], [address] Phone: [phone]   Fax:  [phone]  Name: Gregory Evans MRN: [id number] Date of Birth: [birth year]

## 2019-11-01 ENCOUNTER — Other Ambulatory Visit: Payer: Self-pay | Admitting: Family Medicine

## 2019-11-01 DIAGNOSIS — I10 Essential (primary) hypertension: Secondary | ICD-10-CM

## 2019-12-22 ENCOUNTER — Other Ambulatory Visit (HOSPITAL_COMMUNITY)
Admission: RE | Admit: 2019-12-22 | Discharge: 2019-12-22 | Disposition: A | Discharge status: Home / Self Care | Payer: Managed Care, Other (non HMO) | Source: Ambulatory Visit | Attending: Internal Medicine | Admitting: Internal Medicine

## 2019-12-22 ENCOUNTER — Other Ambulatory Visit: Payer: Self-pay

## 2019-12-22 ENCOUNTER — Encounter (HOSPITAL_COMMUNITY)
Admission: RE | Admit: 2019-12-22 | Discharge: 2019-12-22 | Disposition: A | Discharge status: Home / Self Care | Payer: Managed Care, Other (non HMO) | Source: Ambulatory Visit | Attending: Internal Medicine | Admitting: Internal Medicine

## 2019-12-22 DIAGNOSIS — Z01812 Encounter for preprocedural laboratory examination: Secondary | ICD-10-CM | POA: Insufficient documentation

## 2019-12-22 DIAGNOSIS — Z20822 Contact with and (suspected) exposure to covid-19: Secondary | ICD-10-CM | POA: Diagnosis not present

## 2019-12-22 LAB — SARS CORONAVIRUS 2 (TAT 6-24 HRS): SARS Coronavirus 2: NEGATIVE

## 2019-12-23 ENCOUNTER — Telehealth: Payer: Self-pay

## 2019-12-23 NOTE — Telephone Encounter (Signed)
Received call from [place] at [facility], Tri-lyte is out of stock. They have [place]. She didn't think it was really any different from Tri-lyte. Gave ok to use in place of Tri-lyte.

## 2019-12-26 ENCOUNTER — Ambulatory Visit (HOSPITAL_COMMUNITY)
Admission: RE | Admit: 2019-12-26 | Discharge: 2019-12-26 | Disposition: A | Discharge status: Home / Self Care | Payer: Managed Care, Other (non HMO) | Attending: Internal Medicine | Admitting: Internal Medicine

## 2019-12-26 ENCOUNTER — Encounter (HOSPITAL_COMMUNITY)
Admission: RE | Disposition: A | Discharge status: Home / Self Care | Payer: Self-pay | Source: Home / Self Care | Attending: Internal Medicine

## 2019-12-26 ENCOUNTER — Ambulatory Visit (HOSPITAL_COMMUNITY): Payer: Managed Care, Other (non HMO) | Admitting: Anesthesiology

## 2019-12-26 ENCOUNTER — Encounter (HOSPITAL_COMMUNITY): Payer: Self-pay | Admitting: Internal Medicine

## 2019-12-26 DIAGNOSIS — M1990 Unspecified osteoarthritis, unspecified site: Secondary | ICD-10-CM | POA: Insufficient documentation

## 2019-12-26 DIAGNOSIS — Z87891 Personal history of nicotine dependence: Secondary | ICD-10-CM | POA: Insufficient documentation

## 2019-12-26 DIAGNOSIS — Z1211 Encounter for screening for malignant neoplasm of colon: Secondary | ICD-10-CM | POA: Diagnosis not present

## 2019-12-26 DIAGNOSIS — Z79899 Other long term (current) drug therapy: Secondary | ICD-10-CM | POA: Diagnosis not present

## 2019-12-26 DIAGNOSIS — Z833 Family history of diabetes mellitus: Secondary | ICD-10-CM | POA: Insufficient documentation

## 2019-12-26 DIAGNOSIS — Z8249 Family history of ischemic heart disease and other diseases of the circulatory system: Secondary | ICD-10-CM | POA: Diagnosis not present

## 2019-12-26 DIAGNOSIS — E785 Hyperlipidemia, unspecified: Secondary | ICD-10-CM | POA: Insufficient documentation

## 2019-12-26 DIAGNOSIS — K635 Polyp of colon: Secondary | ICD-10-CM

## 2019-12-26 DIAGNOSIS — D125 Benign neoplasm of sigmoid colon: Secondary | ICD-10-CM | POA: Insufficient documentation

## 2019-12-26 DIAGNOSIS — Z86010 Personal history of colonic polyps: Secondary | ICD-10-CM | POA: Diagnosis not present

## 2019-12-26 DIAGNOSIS — Z791 Long term (current) use of non-steroidal anti-inflammatories (NSAID): Secondary | ICD-10-CM | POA: Insufficient documentation

## 2019-12-26 DIAGNOSIS — Z7982 Long term (current) use of aspirin: Secondary | ICD-10-CM | POA: Diagnosis not present

## 2019-12-26 DIAGNOSIS — Z888 Allergy status to other drugs, medicaments and biological substances status: Secondary | ICD-10-CM | POA: Diagnosis not present

## 2019-12-26 DIAGNOSIS — E559 Vitamin D deficiency, unspecified: Secondary | ICD-10-CM | POA: Insufficient documentation

## 2019-12-26 DIAGNOSIS — K219 Gastro-esophageal reflux disease without esophagitis: Secondary | ICD-10-CM | POA: Insufficient documentation

## 2019-12-26 DIAGNOSIS — I10 Essential (primary) hypertension: Secondary | ICD-10-CM | POA: Insufficient documentation

## 2019-12-26 HISTORY — PX: COLONOSCOPY WITH PROPOFOL: SHX5780

## 2019-12-26 SURGERY — COLONOSCOPY WITH PROPOFOL
Anesthesia: General

## 2019-12-26 MED ORDER — CHLORHEXIDINE GLUCONATE CLOTH 2 % EX PADS: 6.0000 | MEDICATED_PAD | Freq: Once | CUTANEOUS | Status: DC

## 2019-12-26 MED ORDER — LIDOCAINE HCL (CARDIAC) PF 100 MG/5ML IV SOSY
PREFILLED_SYRINGE | INTRAVENOUS | Status: DC | PRN
  Administered 2019-12-26: 100 mg via INTRAVENOUS

## 2019-12-26 MED ORDER — PROPOFOL 500 MG/50ML IV EMUL
INTRAVENOUS | Status: DC | PRN
  Administered 2019-12-26: 200 ug/kg/min via INTRAVENOUS

## 2019-12-26 MED ORDER — LACTATED RINGERS IV SOLN: INTRAVENOUS | Status: DC | PRN

## 2019-12-26 MED ORDER — LACTATED RINGERS IV SOLN
Freq: Once | INTRAVENOUS | Status: AC
  Administered 2019-12-26: 1000 mL via INTRAVENOUS

## 2019-12-26 MED ORDER — STERILE WATER FOR IRRIGATION IR SOLN
Status: DC | PRN
  Administered 2019-12-26: 2.5 mL

## 2019-12-26 NOTE — Anesthesia Postprocedure Evaluation (Signed)
Anesthesia Post Note  Patient: Gregory Evans  Procedure(s) Performed: COLONOSCOPY WITH PROPOFOL (N/A )  Patient location during evaluation: PACU Anesthesia Type: MAC Level of consciousness: awake, oriented, awake and alert and patient cooperative Pain management: pain level controlled Vital Signs Assessment: post-procedure vital signs reviewed and stable Respiratory status: spontaneous breathing, respiratory function stable and nonlabored ventilation Cardiovascular status: stable Postop Assessment: no apparent nausea or vomiting Anesthetic complications: no     Last Vitals:  Vitals:   12/26/19 0939 12/26/19 1200  BP: (!) 152/81 (!) (P) 104/52  Pulse: (!) 46 (!) (P) 48  Resp: 18 (P) 18  Temp: 37.1 C (P) 36.4 C  SpO2: 97% (P) 100%    Last Pain:  Vitals:   12/26/19 0939  TempSrc: Oral  PainSc: 0-No pain                 Simran Bomkamp

## 2019-12-26 NOTE — Discharge Instructions (Signed)
Colonoscopy Discharge Instructions  Read the instructions outlined below and refer to this sheet in the next few weeks. These discharge instructions provide you with general information on caring for yourself after you leave the hospital. Your doctor may also give you specific instructions. While your treatment has been planned according to the most current medical practices available, unavoidable complications occasionally occur. If you have any problems or questions after discharge, call Dr. Gala Romney at [phone]. ACTIVITY  You may resume your regular activity, but move at a slower pace for the next 24 hours.   Take frequent rest periods for the next 24 hours.   Walking will help get rid of the air and reduce the bloated feeling in your belly (abdomen).   No driving for 24 hours (because of the medicine (anesthesia) used during the test).    Do not sign any important legal documents or operate any machinery for 24 hours (because of the anesthesia used during the test).  NUTRITION  Drink plenty of fluids.   You may resume your normal diet as instructed by your doctor.   Begin with a light meal and progress to your normal diet. Heavy or fried foods are harder to digest and may make you feel sick to your stomach (nauseated).   Avoid alcoholic beverages for 24 hours or as instructed.  MEDICATIONS  You may resume your normal medications unless your doctor tells you otherwise.  WHAT YOU CAN EXPECT TODAY  Some feelings of bloating in the abdomen.   Passage of more gas than usual.   Spotting of blood in your stool or on the toilet paper.  IF YOU HAD POLYPS REMOVED DURING THE COLONOSCOPY:  No aspirin products for 7 days or as instructed.   No alcohol for 7 days or as instructed.   Eat a soft diet for the next 24 hours.  FINDING OUT THE RESULTS OF YOUR TEST Not all test results are available during your visit. If your test results are not back during the visit, make an appointment  with your caregiver to find out the results. Do not assume everything is normal if you have not heard from your caregiver or the medical facility. It is important for you to follow up on all of your test results.  SEEK IMMEDIATE MEDICAL ATTENTION IF:  You have more than a spotting of blood in your stool.   Your belly is swollen (abdominal distention).   You are nauseated or vomiting.   You have a temperature over 101.   You have abdominal pain or discomfort that is severe or gets worse throughout the day.    Colon polyp information provided  No MRI until clip gone  Further recommendations to follow pending review of pathology report  At patient request,  I called [place] at  [phone] and reviewed results    Colon Polyps  Polyps are tissue growths inside the body. Polyps can grow in many places, including the large intestine (colon). A polyp may be a round bump or a mushroom-shaped growth. You could have one polyp or several. Most colon polyps are noncancerous (benign). However, some colon polyps can become cancerous over time. Finding and removing the polyps early can help prevent this. What are the causes? The exact cause of colon polyps is not known. What increases the risk? You are more likely to develop this condition if you:  Have a family history of colon cancer or colon polyps.  Are older than 50 or older than 45 if you  are African American.  Have inflammatory bowel disease, such as ulcerative colitis or Crohn's disease.  Have certain hereditary conditions, such as: ? Familial adenomatous polyposis. ? Lynch syndrome. ? Turcot syndrome. ? Peutz-Jeghers syndrome.  Are overweight.  Smoke cigarettes.  Do not get enough exercise.  Drink too much alcohol.  Eat a diet that is high in fat and red meat and low in fiber.  Had childhood cancer that was treated with abdominal radiation. What are the signs or symptoms? Most polyps do not cause symptoms. If you  have symptoms, they may include:  Blood coming from your rectum when having a bowel movement.  Blood in your stool. The stool may look dark red or black.  Abdominal pain.  A change in bowel habits, such as constipation or diarrhea. How is this diagnosed? This condition is diagnosed with a colonoscopy. This is a procedure in which a lighted, flexible scope is inserted into the anus and then passed into the colon to examine the area. Polyps are sometimes found when a colonoscopy is done as part of routine cancer screening tests. How is this treated? Treatment for this condition involves removing any polyps that are found. Most polyps can be removed during a colonoscopy. Those polyps will then be tested for cancer. Additional treatment may be needed depending on the results of testing. Follow these instructions at home: Lifestyle  Maintain a healthy weight, or lose weight if recommended by your health care provider.  Exercise every day or as told by your health care provider.  Do not use any products that contain nicotine or tobacco, such as cigarettes and e-cigarettes. If you need help quitting, ask your health care provider.  If you drink alcohol, limit how much you have: ? 0-1 drink a day for women. ? 0-2 drinks a day for men.  Be aware of how much alcohol is in your drink. In the [place] one drink equals one 12 oz bottle of beer (355 mL), one 5 oz glass of wine (148 mL), or one 1 oz shot of hard liquor (44 mL). Eating and drinking   Eat foods that are high in fiber, such as fruits, vegetables, and whole grains.  Eat foods that are high in calcium and vitamin D, such as milk, cheese, yogurt, eggs, liver, fish, and broccoli.  Limit foods that are high in fat, such as fried foods and desserts.  Limit the amount of red meat and processed meat you eat, such as hot dogs, sausage, bacon, and lunch meats. General instructions  Keep all follow-up visits as told by your health care  provider. This is important. ? This includes having regularly scheduled colonoscopies. ? Talk to your health care provider about when you need a colonoscopy. Contact a health care provider if:  You have new or worsening bleeding during a bowel movement.  You have new or increased blood in your stool.  You have a change in bowel habits.  You lose weight for no known reason. Summary  Polyps are tissue growths inside the body. Polyps can grow in many places, including the colon.  Most colon polyps are noncancerous (benign), but some can become cancerous over time.  This condition is diagnosed with a colonoscopy.  Treatment for this condition involves removing any polyps that are found. Most polyps can be removed during a colonoscopy. This information is not intended to replace advice given to you by your health care provider. Make sure you discuss any questions you have with your health care  provider. Document Revised: 02/18/2018 Document Reviewed: 02/18/2018 Elsevier Patient Education  [place] After These instructions provide you with information about caring for yourself after your procedure. Your health care provider may also give you more specific instructions. Your treatment has been planned according to current medical practices, but problems sometimes occur. Call your health care provider if you have any problems or questions after your procedure. What can I expect after the procedure? After your procedure, you may:  Feel sleepy for several hours.  Feel clumsy and have poor balance for several hours.  Feel forgetful about what happened after the procedure.  Have poor judgment for several hours.  Feel nauseous or vomit.  Have a sore throat if you had a breathing tube during the procedure. Follow these instructions at home: For at least 24 hours after the procedure:      Have a responsible adult stay with you. It is  important to have someone help care for you until you are awake and alert.  Rest as needed.  Do not: ? Participate in activities in which you could fall or become injured. ? Drive. ? Use heavy machinery. ? Drink alcohol. ? Take sleeping pills or medicines that cause drowsiness. ? Make important decisions or sign legal documents. ? Take care of children on your own. Eating and drinking  Follow the diet that is recommended by your health care provider.  If you vomit, drink water, juice, or soup when you can drink without vomiting.  Make sure you have little or no nausea before eating solid foods. General instructions  Take over-the-counter and prescription medicines only as told by your health care provider.  If you have sleep apnea, surgery and certain medicines can increase your risk for breathing problems. Follow instructions from your health care provider about wearing your sleep device: ? Anytime you are sleeping, including during daytime naps. ? While taking prescription pain medicines, sleeping medicines, or medicines that make you drowsy.  If you smoke, do not smoke without supervision.  Keep all follow-up visits as told by your health care provider. This is important. Contact a health care provider if:  You keep feeling nauseous or you keep vomiting.  You feel light-headed.  You develop a rash.  You have a fever. Get help right away if:  You have trouble breathing. Summary  For several hours after your procedure, you may feel sleepy and have poor judgment.  Have a responsible adult stay with you for at least 24 hours or until you are awake and alert. This information is not intended to replace advice given to you by your health care provider. Make sure you discuss any questions you have with your health care provider. Document Revised: 02/01/2018 Document Reviewed: 02/24/2016 Elsevier Patient Education  [place].

## 2019-12-26 NOTE — Op Note (Signed)
[facility] Patient Name: Gregory Evans Procedure Date: 12/26/2019 11:11 AM MRN: [id number] Date of Birth: 05/11/1956 Attending MD: Norvel Richards , MD CSN: [id number] Age: 64 Admit Type: Outpatient Procedure:                Colonoscopy Indications:              High risk colon cancer surveillance: Personal                            history of colonic polyps Providers:                Norvel Richards, MD, Lurline Del, RN, Raphael Gibney, Technician Referring MD:              Medicines:                Propofol per Anesthesia Complications:            No immediate complications. Estimated Blood Loss:     Estimated blood loss was minimal. Procedure:                Pre-Anesthesia Assessment:                           - Prior to the procedure, a History and Physical                            was performed, and patient medications and                            allergies were reviewed. The patient's tolerance of                            previous anesthesia was also reviewed. The risks                            and benefits of the procedure and the sedation                            options and risks were discussed with the patient.                            All questions were answered, and informed consent                            was obtained. Prior Anticoagulants: The patient has                            taken no previous anticoagulant or antiplatelet                            agents. ASA Grade Assessment: II - A patient with  mild systemic disease. After reviewing the risks                            and benefits, the patient was deemed in                            satisfactory condition to undergo the procedure.                           After obtaining informed consent, the colonoscope                            was passed under direct vision. Throughout the                            procedure, the patient's  blood pressure, pulse, and                            oxygen saturations were monitored continuously. The                            [id number]) scope was introduced through                            the anus and advanced to the the cecum, identified                            by appendiceal orifice and ileocecal valve. The                            colonoscopy was performed without difficulty. The                            patient tolerated the procedure well. The quality                            of the bowel preparation was adequate. Scope In: 11:34:49 AM Scope Out: 11:53:23 AM Scope Withdrawal Time: 0 hours 14 minutes 48 seconds  Total Procedure Duration: 0 hours 18 minutes 34 seconds  Findings:      The perianal and digital rectal examinations were normal.      A 15 mm polyp was found in the sigmoid colon. The polyp was       pedunculated. Clips x 2 placed at the polyp base. The polyp was removed       with a hot snare. Resection and retrieval were complete. Estimated blood       loss was minimal.      The exam was otherwise without abnormality on direct and retroflexion       views. Impression:               - One 15 mm polyp in the sigmoid colon, removed                            with a hot snare. Resected and retrieved. Clips  placed                           - The examination was otherwise normal on direct                            and retroflexion views. Moderate Sedation:      Moderate (conscious) sedation was personally administered by an       anesthesia professional. The following parameters were monitored: oxygen       saturation, heart rate, blood pressure, respiratory rate, EKG, adequacy       of pulmonary ventilation, and response to care. Recommendation:           - Patient has a contact number available for                            emergencies. The signs and symptoms of potential                            delayed  complications were discussed with the                            patient. Return to normal activities tomorrow.                            Written discharge instructions were provided to the                            patient.                           - Resume previous diet.                           - Continue present medications.                           - Repeat colonoscopy date to be determined after                            pending pathology results are reviewed for                            surveillance.                           - Return to GI office (date not yet determined).                            MRI until clips gone Procedure Code(s):        --- Professional ---                           [phone], Colonoscopy, flexible; with removal of                            tumor(s), polyp(s), or other  lesion(s) by snare                            technique Diagnosis Code(s):        --- Professional ---                           Z86.010, Personal history of colonic polyps                           K63.5, Polyp of colon CPT copyright 2019 American Medical Association. All rights reserved. The codes documented in this report are preliminary and upon coder review may  be revised to meet current compliance requirements. Cristopher Estimable. Lexiana Spindel, MD Norvel Richards, MD 12/26/2019 12:00:48 PM This report has been signed electronically. Number of Addenda: 0

## 2019-12-26 NOTE — Anesthesia Preprocedure Evaluation (Signed)
Anesthesia Evaluation  Patient identified by MRN, date of birth, ID band Patient awake    Reviewed: Allergy & Precautions, NPO status , Patient's Chart, lab work & pertinent test results, reviewed documented beta blocker date and time   Airway Mallampati: III  TM Distance: >3 FB Neck ROM: Full    Dental  (+) Missing, Dental Advisory Given   Pulmonary former smoker,    Pulmonary exam normal breath sounds clear to auscultation       Cardiovascular Exercise Tolerance: Good hypertension, Pt. on medications and Pt. on home beta blockers Normal cardiovascular exam Rhythm:Regular Rate:Normal     Neuro/Psych    GI/Hepatic GERD  Medicated,(+)     substance abuse  alcohol use,   Endo/Other  negative endocrine ROS  Renal/GU negative Renal ROS     Musculoskeletal  (+) Arthritis ,   Abdominal   Peds  Hematology negative hematology ROS (+)   Anesthesia Other Findings   Reproductive/Obstetrics                            Anesthesia Physical Anesthesia Plan  ASA: III  Anesthesia Plan: General   Post-op Pain Management:    Induction: Intravenous  PONV Risk Score and Plan:   Airway Management Planned: Nasal Cannula and Natural Airway  Additional Equipment:   Intra-op Plan:   Post-operative Plan:   Informed Consent: I have reviewed the patients History and Physical, chart, labs and discussed the procedure including the risks, benefits and alternatives for the proposed anesthesia with the patient or authorized representative who has indicated his/her understanding and acceptance.     Dental advisory given  Plan Discussed with: CRNA  Anesthesia Plan Comments:        Anesthesia Quick Evaluation

## 2019-12-26 NOTE — Transfer of Care (Signed)
Immediate Anesthesia Transfer of Care Note  Patient: Gregory Evans  Procedure(s) Performed: COLONOSCOPY WITH PROPOFOL (N/A )  Patient Location: PACU  Anesthesia Type:MAC  Level of Consciousness: awake, alert , oriented and patient cooperative  Airway & Oxygen Therapy: Patient Spontanous Breathing  Post-op Assessment: Report given to RN, Post -op Vital signs reviewed and stable and Patient moving all extremities X 4  Post vital signs: Reviewed and stable  Last Vitals:  Vitals Value Taken Time  BP    Temp    Pulse 49 12/26/19 1202  Resp 10 12/26/19 1202  SpO2 96 % 12/26/19 1202  Vitals shown include unvalidated device data.  Last Pain:  Vitals:   12/26/19 0939  TempSrc: Oral  PainSc: 0-No pain      Patients Stated Pain Goal: 9 (20/94/70 9628)  Complications: No apparent anesthesia complications

## 2019-12-26 NOTE — H&P (Signed)
@LOGO @   Primary Care Physician:  Claretta Fraise, MD Primary Gastroenterologist:  Dr. Gala Romney  Pre-Procedure History & Physical: HPI:  Gregory Evans is a 64 y.o. male here for colonoscopy.  Possible history colonic polyps about 10 years ago.  Patient is not sure even where he had procedure performed.  No bowel symptoms currently.  Referred for follow-up colonoscopy.   Past Medical History:  Diagnosis Date  . Allergy   . Colon polyp 2008  . Erectile dysfunction   . Hyperlipidemia   . Hypertension   . Osteoarthritis   . Vitamin D deficiency     Past Surgical History:  Procedure Laterality Date  . CYSTECTOMY     patietn reports removal of chest wall cyst as a teenager     Prior to Admission medications   Medication Sig Start Date End Date Taking? Authorizing Provider  acetaminophen (TYLENOL) 325 MG tablet Take 650 mg by mouth every 6 (six) hours as needed for moderate pain or headache.   Yes [provider]  amLODipine (NORVASC) 10 MG tablet TAKE 1 TABLET (10 MG TOTAL) BY MOUTH DAILY. Patient taking differently: Take 10 mg by mouth every evening.  08/11/19  Yes Claretta Fraise, MD  aspirin EC 81 MG tablet Take 81 mg by mouth daily.   Yes [provider]  cholecalciferol (VITAMIN D3) 25 MCG (1000 UNIT) tablet Take 1,000 Units by mouth daily.   Yes [provider]  diclofenac (VOLTAREN) 75 MG EC tablet Take 1 tablet (75 mg total) by mouth 2 (two) times daily. 08/11/19  Yes Claretta Fraise, MD  hydrOXYzine (VISTARIL) 25 MG capsule Take 1 capsule (25 mg total) by mouth 2 (two) times daily. Patient taking differently: Take 25 mg by mouth 2 (two) times daily as needed for anxiety.  08/11/19  Yes Claretta Fraise, MD  levocetirizine (XYZAL) 5 MG tablet Take 1 tablet (5 mg total) by mouth every evening. 08/11/19  Yes Stacks, Cletus Gash, MD  Menthol-Methyl Salicylate (MUSCLE RUB) 10-15 % CREA Apply 1 application topically as needed for muscle pain.   Yes [provider]   metoprolol tartrate (LOPRESSOR) 100 MG tablet TAKE 1 TABLET BY MOUTH TWICE A DAY Patient taking differently: Take 100 mg by mouth 2 (two) times daily.  11/01/19  Yes Claretta Fraise, MD  Multiple Vitamins-Minerals (EMERGEN-C IMMUNE) PACK Take 1 packet by mouth daily.   Yes [provider]  Multiple Vitamins-Minerals (ZINC PO) Take 1 tablet by mouth daily.   Yes [provider]  omeprazole (PRILOSEC) 20 MG capsule Take 1 capsule (20 mg total) by mouth daily before breakfast. Patient taking differently: Take 20 mg by mouth daily as needed (acid reflux).  09/29/19  Yes Aliene Altes S, PA-C  polyethylene glycol-electrolytes (TRILYTE) 420 g solution Take 4,000 mLs by mouth as directed. 09/29/19  Yes Ena Demary, Cristopher Estimable, MD  sildenafil (REVATIO) 20 MG tablet Take 1 tablet (20 mg total) by mouth 3 (three) times daily. Patient taking differently: Take 20 mg by mouth daily as needed (ED).  08/11/19  Yes Stacks, Cletus Gash, MD  nebivolol (BYSTOLIC) 10 MG tablet Take 2 tablets (20 mg total) by mouth daily. 04/21/14 05/10/14  Sharion Balloon, FNP    Allergies as of 09/29/2019 - Review Complete 09/29/2019  Allergen Reaction Noted  . Ace inhibitors  08/19/2013  . Cozaar [losartan potassium]  08/19/2013    Family History  Problem Relation Age of Onset  . Hypertension Mother   . Dementia Mother   . Diabetes Father 15  .  Colon cancer Neg Hx     Social History   Socioeconomic History  . Marital status: Married    Spouse name: Not on file  . Number of children: Not on file  . Years of education: Not on file  . Highest education level: Not on file  Occupational History    Employer: [employer] [place]  Tobacco Use  . Smoking status: Former Smoker    Quit date: 08/19/1993    Years since quitting: 26.3  . Smokeless tobacco: Never Used  Substance and Sexual Activity  . Alcohol use: Yes    Comment: 4 shots daily  . Drug use: No  . Sexual activity: Not on file  Other Topics  Concern  . Not on file  Social History Narrative   Married    6 children   Outside pet   Social Determinants of Health   Financial Resource Strain:   . Difficulty of Paying Living Expenses: Not on file  Food Insecurity:   . Worried About Charity fundraiser in the Last Year: Not on file  . Ran Out of Food in the Last Year: Not on file  Transportation Needs:   . Lack of Transportation (Medical): Not on file  . Lack of Transportation (Non-Medical): Not on file  Physical Activity:   . Days of Exercise per Week: Not on file  . Minutes of Exercise per Session: Not on file  Stress:   . Feeling of Stress : Not on file  Social Connections:   . Frequency of Communication with Friends and Family: Not on file  . Frequency of Social Gatherings with Friends and Family: Not on file  . Attends Religious Services: Not on file  . Active Member of Clubs or Organizations: Not on file  . Attends Archivist Meetings: Not on file  . Marital Status: Not on file  Intimate Partner Violence:   . Fear of Current or Ex-Partner: Not on file  . Emotionally Abused: Not on file  . Physically Abused: Not on file  . Sexually Abused: Not on file    Review of Systems: See HPI, otherwise negative ROS  Physical Exam: BP (!) 152/81   Pulse (!) 46   Temp 98.8 F (37.1 C) (Oral)   Resp 18   SpO2 97%  General:   Alert,  Well-developed, well-nourished, pleasant and cooperative in NAD Neck:  Supple; no masses or thyromegaly. No significant cervical adenopathy. Lungs:  Clear throughout to auscultation.   No wheezes, crackles, or rhonchi. No acute distress. Heart:  Regular rate and rhythm; no murmurs, clicks, rubs,  or gallops. Abdomen: Non-distended, normal bowel sounds.  Soft and nontender without appreciable mass or hepatosplenomegaly.  Pulses:  Normal pulses noted. Extremities:  Without clubbing or edema.  Impression/Plan: 64 year old gentleman with possible history of distant colon polyp  removal.  Here for follow-up colonoscopy.  No family history of colon cancer.  No bowel symptoms currently.  The risks, benefits, limitations, alternatives and imponderables have been reviewed with the patient. Questions have been answered. All parties are agreeable.        Notice: This dictation was prepared with Dragon dictation along with smaller phrase technology. Any transcriptional errors that result from this process are unintentional and may not be corrected upon review.

## 2019-12-27 ENCOUNTER — Encounter: Payer: Self-pay | Admitting: Internal Medicine

## 2019-12-27 LAB — SURGICAL PATHOLOGY

## 2020-02-08 ENCOUNTER — Encounter: Payer: Self-pay | Admitting: Family Medicine

## 2020-02-08 ENCOUNTER — Ambulatory Visit (INDEPENDENT_AMBULATORY_CARE_PROVIDER_SITE_OTHER): Payer: Managed Care, Other (non HMO) | Admitting: Family Medicine

## 2020-02-08 ENCOUNTER — Other Ambulatory Visit: Payer: Self-pay

## 2020-02-08 VITALS — BP 128/69 | HR 51 | Temp 99.1°F | Ht 71.5 in | Wt 271.2 lb

## 2020-02-08 DIAGNOSIS — Z23 Encounter for immunization: Secondary | ICD-10-CM

## 2020-02-08 DIAGNOSIS — E559 Vitamin D deficiency, unspecified: Secondary | ICD-10-CM

## 2020-02-08 DIAGNOSIS — E782 Mixed hyperlipidemia: Secondary | ICD-10-CM

## 2020-02-08 DIAGNOSIS — M25511 Pain in right shoulder: Secondary | ICD-10-CM

## 2020-02-08 DIAGNOSIS — M1990 Unspecified osteoarthritis, unspecified site: Secondary | ICD-10-CM

## 2020-02-08 DIAGNOSIS — I10 Essential (primary) hypertension: Secondary | ICD-10-CM | POA: Diagnosis not present

## 2020-02-08 DIAGNOSIS — Z114 Encounter for screening for human immunodeficiency virus [HIV]: Secondary | ICD-10-CM

## 2020-02-08 DIAGNOSIS — G8929 Other chronic pain: Secondary | ICD-10-CM

## 2020-02-08 DIAGNOSIS — Z791 Long term (current) use of non-steroidal anti-inflammatories (NSAID): Secondary | ICD-10-CM

## 2020-02-08 MED ORDER — METOPROLOL TARTRATE 100 MG PO TABS: 100.0000 mg | ORAL_TABLET | Freq: Two times a day (BID) | ORAL | 0 refills | Status: DC

## 2020-02-08 MED ORDER — DICLOFENAC SODIUM 75 MG PO TBEC: 75.0000 mg | DELAYED_RELEASE_TABLET | Freq: Two times a day (BID) | ORAL | 1 refills | Status: DC

## 2020-02-08 MED ORDER — AMLODIPINE BESYLATE 10 MG PO TABS: ORAL_TABLET | ORAL | 3 refills | Status: DC

## 2020-02-08 MED ORDER — OMEPRAZOLE 20 MG PO CPDR: 20.0000 mg | DELAYED_RELEASE_CAPSULE | Freq: Every day | ORAL | 3 refills | Status: DC

## 2020-02-08 NOTE — Progress Notes (Signed)
Subjective:  Patient ID: Gregory Evans, male    DOB: [birth year]  Age: 64 y.o. MRN: [id number]  CC: Follow-up (6 month)   HPI Gregory Evans presents for follow-up of hypertension. Patient has no history of headache chest pain or shortness of breath or recent cough. Patient also denies symptoms of TIA such as numbness weakness lateralizing. Patient checks  blood pressure at home and has not had any elevated readings recently. Patient denies side effects from his medication. States taking it regularly. Patient in for follow-up of elevated cholesterol. Doing well without complaints on current medication. Denies side effects of statin including myalgia and arthralgia and nausea. Also in today for liver function testing. Currently no chest pain, shortness of breath or other cardiovascular related symptoms noted.  Quality review tells me he is due for an HIV screen he is agreeable to that.  He is also due for his Prevnar and a tetanus booster.  Patient recently had colonoscopy.  There was a 5 cm polyp that was removed.  They want him back in 5 years.  That will be for repeat colonoscopy.  He had 2 endoscopic clips placed that precludes him from MRI in the near future unless cleared by radiology.  Patient is having a lot of pain in the right shoulder.  It is much better than last time.  He says about 80% better.  However, he cannot abduct beyond 90 degrees.  Depression screen [facility] 2/9 02/08/2020 08/11/2019 02/08/2019  Decreased Interest 0 0 0  Down, Depressed, Hopeless 0 0 0  PHQ - 2 Score 0 0 0    History Gregory Evans has a past medical history of Allergy, Colon polyp (2008), Erectile dysfunction, Hyperlipidemia, Hypertension, Osteoarthritis, and Vitamin D deficiency.   He has a past surgical history that includes Cystectomy and Colonoscopy with propofol (N/A, 12/26/2019).   His family history includes Dementia in his mother; Diabetes (age of onset: 88) in his father; Hypertension in his mother.He reports that  he quit smoking about 26 years ago. He has never used smokeless tobacco. He reports current alcohol use. He reports that he does not use drugs.    ROS Review of Systems  Constitutional: Negative.   HENT: Negative.   Eyes: Negative for visual disturbance.  Respiratory: Negative for cough and shortness of breath.   Cardiovascular: Negative for chest pain and leg swelling.  Gastrointestinal: Negative for abdominal pain, diarrhea, nausea and vomiting.  Genitourinary: Negative for difficulty urinating.  Musculoskeletal: Positive for arthralgias. Negative for myalgias.  Skin: Negative for rash.  Neurological: Negative for headaches.  Psychiatric/Behavioral: Negative for sleep disturbance.    Objective:  BP 128/69   Pulse (!) 51   Temp 99.1 F (37.3 C) (Temporal)   Ht 5' 11.5" (1.816 m)   Wt 271 lb 3.2 oz (123 kg)   BMI 37.30 kg/m   BP Readings from Last 3 Encounters:  02/08/20 128/69  12/26/19 (!) 162/64  09/29/19 (!) 187/83    Wt Readings from Last 3 Encounters:  02/08/20 271 lb 3.2 oz (123 kg)  09/29/19 270 lb 3.2 oz (122.6 kg)  08/11/19 266 lb 6.4 oz (120.8 kg)     Physical Exam Constitutional:      General: He is not in acute distress.    Appearance: He is well-developed.  HENT:     Head: Normocephalic and atraumatic.     Right Ear: External ear normal.     Left Ear: External ear normal.     Nose: Nose normal.  Eyes:     Conjunctiva/sclera: Conjunctivae normal.     Pupils: Pupils are equal, round, and reactive to light.  Cardiovascular:     Rate and Rhythm: Normal rate and regular rhythm.     Heart sounds: Normal heart sounds. No murmur.  Pulmonary:     Effort: Pulmonary effort is normal. No respiratory distress.     Breath sounds: Normal breath sounds. No wheezing or rales.  Abdominal:     Palpations: Abdomen is soft.     Tenderness: There is no abdominal tenderness.  Musculoskeletal:        General: Normal range of motion.     Cervical back: Normal  range of motion and neck supple.  Skin:    General: Skin is warm and dry.  Neurological:     Mental Status: He is alert and oriented to person, place, and time.     Deep Tendon Reflexes: Reflexes are normal and symmetric.  Psychiatric:        Behavior: Behavior normal.        Thought Content: Thought content normal.        Judgment: Judgment normal.       Assessment & Plan:   Gregory Evans was seen today for follow-up.  Diagnoses and all orders for this visit:  Essential hypertension -     CBC with Differential/Platelet -     CMP14+EGFR -     amLODipine (NORVASC) 10 MG tablet; TAKE 1 TABLET (10 MG TOTAL) BY MOUTH DAILY. -     metoprolol tartrate (LOPRESSOR) 100 MG tablet; Take 1 tablet (100 mg total) by mouth 2 (two) times daily.  Vitamin D deficiency -     CBC with Differential/Platelet -     CMP14+EGFR  Osteoarthritis, unspecified osteoarthritis type, unspecified site -     CBC with Differential/Platelet -     CMP14+EGFR -     diclofenac (VOLTAREN) 75 MG EC tablet; Take 1 tablet (75 mg total) by mouth 2 (two) times daily.  Mixed hyperlipidemia -     CBC with Differential/Platelet -     CMP14+EGFR -     Lipid panel  NSAID long-term use -     CBC with Differential/Platelet -     CMP14+EGFR -     omeprazole (PRILOSEC) 20 MG capsule; Take 1 capsule (20 mg total) by mouth daily before breakfast.  Encounter for screening for HIV -     HIV Antibody (routine testing w rflx)  Chronic right shoulder pain -     Ambulatory referral to Orthopedics  Other orders -     Tdap vaccine greater than or equal to 7yo IM -     Pneumococcal conjugate vaccine 13-valent       I have discontinued Gregory Evans's hydrOXYzine, sildenafil, polyethylene glycol-electrolytes, cholecalciferol, Emergen-C Immune, and Multiple Vitamins-Minerals (ZINC PO). I have also changed his metoprolol tartrate. Additionally, I am having him maintain his aspirin EC, levocetirizine, acetaminophen, Muscle Rub,  amLODipine, diclofenac, and omeprazole.  Allergies as of 02/08/2020      Reactions   Ace Inhibitors    angioedema   Cozaar [losartan Potassium]    Swollen uvula      Medication List       Accurate as of February 08, 2020  5:34 PM. If you have any questions, ask your nurse or doctor.        STOP taking these medications   cholecalciferol 25 MCG (1000 UNIT) tablet Commonly known as: VITAMIN D3 Stopped by: Claretta Fraise, MD  Emergen-C Immune Pack Stopped by: Claretta Fraise, MD   hydrOXYzine 25 MG capsule Commonly known as: VISTARIL Stopped by: Claretta Fraise, MD   polyethylene glycol-electrolytes 420 g solution Commonly known as: TriLyte Stopped by: Claretta Fraise, MD   sildenafil 20 MG tablet Commonly known as: REVATIO Stopped by: Claretta Fraise, MD   ZINC PO Stopped by: Claretta Fraise, MD     TAKE these medications   acetaminophen 325 MG tablet Commonly known as: TYLENOL Take 650 mg by mouth every 6 (six) hours as needed for moderate pain or headache.   amLODipine 10 MG tablet Commonly known as: NORVASC TAKE 1 TABLET (10 MG TOTAL) BY MOUTH DAILY. What changed:   how much to take  how to take this  when to take this  additional instructions   aspirin EC 81 MG tablet Take 81 mg by mouth daily.   diclofenac 75 MG EC tablet Commonly known as: VOLTAREN Take 1 tablet (75 mg total) by mouth 2 (two) times daily.   levocetirizine 5 MG tablet Commonly known as: Xyzal Take 1 tablet (5 mg total) by mouth every evening.   metoprolol tartrate 100 MG tablet Commonly known as: LOPRESSOR Take 1 tablet (100 mg total) by mouth 2 (two) times daily.   Muscle Rub 10-15 % Crea Apply 1 application topically as needed for muscle pain.   omeprazole 20 MG capsule Commonly known as: PRILOSEC Take 1 capsule (20 mg total) by mouth daily before breakfast. What changed:   when to take this  reasons to take this        Follow-up: Return in about 6 months (around  08/10/2020) for Compete physical, hypertension, cholesterol.  Claretta Fraise, M.D.

## 2020-02-09 LAB — CMP14+EGFR
ALT: 23 IU/L (ref 0–44)
AST: 20 IU/L (ref 0–40)
Albumin/Globulin Ratio: 1.7 (ref 1.2–2.2)
Albumin: 4.4 g/dL (ref 3.8–4.8)
Alkaline Phosphatase: 50 IU/L (ref 39–117)
BUN/Creatinine Ratio: 19 (ref 10–24)
BUN: 23 mg/dL (ref 8–27)
Bilirubin Total: 0.4 mg/dL (ref 0.0–1.2)
CO2: 21 mmol/L (ref 20–29)
Calcium: 9.6 mg/dL (ref 8.6–10.2)
Chloride: 103 mmol/L (ref 96–106)
Creatinine, Ser: 1.19 mg/dL (ref 0.76–1.27)
GFR calc Af Amer: 74 mL/min/{1.73_m2} (ref >59)
GFR calc non Af Amer: 64 mL/min/{1.73_m2} (ref >59)
Globulin, Total: 2.6 g/dL (ref 1.5–4.5)
Glucose: 100 mg/dL — ABNORMAL HIGH (ref 65–99)
Potassium: 4.6 mmol/L (ref 3.5–5.2)
Sodium: 142 mmol/L (ref 134–144)
Total Protein: 7 g/dL (ref 6.0–8.5)

## 2020-02-09 LAB — LIPID PANEL
Chol/HDL Ratio: 4.1 ratio (ref 0.0–5.0)
Cholesterol, Total: 182 mg/dL (ref 100–199)
HDL: 44 mg/dL (ref >39)
LDL Chol Calc (NIH): 101 mg/dL — ABNORMAL HIGH (ref 0–99)
Triglycerides: 216 mg/dL — ABNORMAL HIGH (ref 0–149)
VLDL Cholesterol Cal: 37 mg/dL (ref 5–40)

## 2020-02-09 LAB — CBC WITH DIFFERENTIAL/PLATELET
Basophils Absolute: 0.1 10*3/uL (ref 0.0–0.2)
Basos: 1 %
EOS (ABSOLUTE): 0.4 10*3/uL (ref 0.0–0.4)
Eos: 6 %
Hematocrit: 39.1 % (ref 37.5–51.0)
Hemoglobin: 13.1 g/dL (ref 13.0–17.7)
Immature Grans (Abs): 0 10*3/uL (ref 0.0–0.1)
Immature Granulocytes: 0 %
Lymphocytes Absolute: 1.7 10*3/uL (ref 0.7–3.1)
Lymphs: 27 %
MCH: 31.1 pg (ref 26.6–33.0)
MCHC: 33.5 g/dL (ref 31.5–35.7)
MCV: 93 fL (ref 79–97)
Monocytes Absolute: 0.7 10*3/uL (ref 0.1–0.9)
Monocytes: 11 %
Neutrophils Absolute: 3.3 10*3/uL (ref 1.4–7.0)
Neutrophils: 55 %
Platelets: 218 10*3/uL (ref 150–450)
RBC: 4.21 x10E6/uL (ref 4.14–5.80)
RDW: 11.5 % — ABNORMAL LOW (ref 11.6–15.4)
WBC: 6.1 10*3/uL (ref 3.4–10.8)

## 2020-02-09 LAB — HIV ANTIBODY (ROUTINE TESTING W REFLEX): HIV Screen 4th Generation wRfx: NONREACTIVE

## 2020-02-10 NOTE — Progress Notes (Signed)
Hello  Hazem,    Your lab result is normal and/or stable.Some minor variations that are not significant are commonly marked abnormal, but do not represent any medical problem for you.   Best regards,  Claretta Fraise, M.D.

## 2020-03-08 ENCOUNTER — Other Ambulatory Visit: Payer: Self-pay | Admitting: Family Medicine

## 2020-04-19 ENCOUNTER — Encounter: Payer: Self-pay | Admitting: *Deleted

## 2020-05-19 ENCOUNTER — Other Ambulatory Visit: Payer: Self-pay | Admitting: Family Medicine

## 2020-05-19 DIAGNOSIS — I10 Essential (primary) hypertension: Secondary | ICD-10-CM

## 2020-08-07 ENCOUNTER — Other Ambulatory Visit: Payer: Self-pay | Admitting: Family Medicine

## 2020-08-07 DIAGNOSIS — M1990 Unspecified osteoarthritis, unspecified site: Secondary | ICD-10-CM

## 2020-08-13 ENCOUNTER — Encounter: Payer: Managed Care, Other (non HMO) | Admitting: Family Medicine

## 2020-08-14 ENCOUNTER — Other Ambulatory Visit: Payer: Self-pay

## 2020-08-14 ENCOUNTER — Ambulatory Visit (INDEPENDENT_AMBULATORY_CARE_PROVIDER_SITE_OTHER): Payer: Managed Care, Other (non HMO) | Admitting: Family Medicine

## 2020-08-14 ENCOUNTER — Encounter: Payer: Self-pay | Admitting: Family Medicine

## 2020-08-14 VITALS — BP 135/69 | HR 61 | Temp 98.3°F | Resp 20 | Ht 71.5 in | Wt 267.0 lb

## 2020-08-14 DIAGNOSIS — M25511 Pain in right shoulder: Secondary | ICD-10-CM

## 2020-08-14 DIAGNOSIS — K2100 Gastro-esophageal reflux disease with esophagitis, without bleeding: Secondary | ICD-10-CM

## 2020-08-14 DIAGNOSIS — Z125 Encounter for screening for malignant neoplasm of prostate: Secondary | ICD-10-CM

## 2020-08-14 DIAGNOSIS — M2141 Flat foot [pes planus] (acquired), right foot: Secondary | ICD-10-CM

## 2020-08-14 DIAGNOSIS — G8929 Other chronic pain: Secondary | ICD-10-CM

## 2020-08-14 DIAGNOSIS — K429 Umbilical hernia without obstruction or gangrene: Secondary | ICD-10-CM

## 2020-08-14 DIAGNOSIS — Z0001 Encounter for general adult medical examination with abnormal findings: Secondary | ICD-10-CM | POA: Diagnosis not present

## 2020-08-14 DIAGNOSIS — R011 Cardiac murmur, unspecified: Secondary | ICD-10-CM

## 2020-08-14 DIAGNOSIS — M1990 Unspecified osteoarthritis, unspecified site: Secondary | ICD-10-CM

## 2020-08-14 DIAGNOSIS — K439 Ventral hernia without obstruction or gangrene: Secondary | ICD-10-CM | POA: Diagnosis not present

## 2020-08-14 DIAGNOSIS — E559 Vitamin D deficiency, unspecified: Secondary | ICD-10-CM

## 2020-08-14 DIAGNOSIS — Z791 Long term (current) use of non-steroidal anti-inflammatories (NSAID): Secondary | ICD-10-CM

## 2020-08-14 DIAGNOSIS — Z0000 Encounter for general adult medical examination without abnormal findings: Secondary | ICD-10-CM

## 2020-08-14 DIAGNOSIS — E782 Mixed hyperlipidemia: Secondary | ICD-10-CM

## 2020-08-14 DIAGNOSIS — J302 Other seasonal allergic rhinitis: Secondary | ICD-10-CM

## 2020-08-14 DIAGNOSIS — I10 Essential (primary) hypertension: Secondary | ICD-10-CM

## 2020-08-14 DIAGNOSIS — M2142 Flat foot [pes planus] (acquired), left foot: Secondary | ICD-10-CM

## 2020-08-14 MED ORDER — OMEPRAZOLE 40 MG PO CPDR: 40.0000 mg | DELAYED_RELEASE_CAPSULE | Freq: Every day | ORAL | 3 refills | Status: DC

## 2020-08-14 MED ORDER — METOPROLOL TARTRATE 100 MG PO TABS: 100.0000 mg | ORAL_TABLET | Freq: Two times a day (BID) | ORAL | 1 refills | Status: DC

## 2020-08-14 MED ORDER — AMLODIPINE BESYLATE 10 MG PO TABS: ORAL_TABLET | ORAL | 3 refills | Status: DC

## 2020-08-14 MED ORDER — DICLOFENAC SODIUM 75 MG PO TBEC: 75.0000 mg | DELAYED_RELEASE_TABLET | Freq: Two times a day (BID) | ORAL | 1 refills | Status: DC

## 2020-08-14 MED ORDER — FEXOFENADINE HCL 180 MG PO TABS: 180.0000 mg | ORAL_TABLET | Freq: Every day | ORAL | 11 refills | Status: DC

## 2020-08-14 NOTE — Progress Notes (Signed)
Subjective:  Patient ID: Gregory Evans, male    DOB: 1955/12/31  Age: 64 y.o. MRN: [id number]  CC: Annual Exam   HPI Gregory Evans presents for complete physical. Patient states that he was having some swelling in his legs.  However, he quit drinking beer and the swelling went away.  This was primarily at the left ankle.  His reflux is not well controlled.  Omeprazole is not giving adequate relief.  He would like to try something else.  Patient has allergic rhinitis symptoms including sneezing frequently sniffling, clear rhinorrhea, watery and itchy eyes. There has been no fever no chills no sweats. No earaches. There is some scratchy throat but no sore throat or difficulty swallowing. There is some nasal congestion.   Depression screen [facility] 2/9 08/14/2020 02/08/2020 08/11/2019  Decreased Interest 0 0 0  Down, Depressed, Hopeless 0 0 0  PHQ - 2 Score 0 0 0    History Gregory Evans has a past medical history of Allergy, Colon polyp (2008), Erectile dysfunction, Hyperlipidemia, Hypertension, Osteoarthritis, and Vitamin D deficiency.   He has a past surgical history that includes Cystectomy and Colonoscopy with propofol (N/A, 12/26/2019).   His family history includes Dementia in his mother; Diabetes (age of onset: 81) in his father; Hypertension in his mother.He reports that he quit smoking about 27 years ago. He has never used smokeless tobacco. He reports current alcohol use. He reports that he does not use drugs.    ROS Review of Systems  Constitutional: Negative for fever.  HENT: Positive for congestion, postnasal drip and sneezing.   Respiratory: Negative for shortness of breath.   Cardiovascular: Negative for chest pain.  Gastrointestinal: Positive for abdominal pain.  Musculoskeletal: Negative for arthralgias.  Skin: Negative for rash.    Objective:  BP 135/69   Pulse 61   Temp 98.3 F (36.8 C) (Temporal)   Resp 20   Ht 5' 11.5" (1.816 m)   Wt 267 lb (121.1 kg)   SpO2 96%   BMI  36.72 kg/m   BP Readings from Last 3 Encounters:  08/14/20 135/69  02/08/20 128/69  12/26/19 (!) 162/64    Wt Readings from Last 3 Encounters:  08/14/20 267 lb (121.1 kg)  02/08/20 271 lb 3.2 oz (123 kg)  09/29/19 270 lb 3.2 oz (122.6 kg)     Physical Exam Vitals reviewed.  Constitutional:      Appearance: He is well-developed.  HENT:     Head: Normocephalic and atraumatic.     Right Ear: Tympanic membrane and external ear normal. No decreased hearing noted.     Left Ear: Tympanic membrane and external ear normal. No decreased hearing noted.     Mouth/Throat:     Pharynx: No oropharyngeal exudate or posterior oropharyngeal erythema.  Eyes:     Pupils: Pupils are equal, round, and reactive to light.  Cardiovascular:     Rate and Rhythm: Normal rate and regular rhythm.     Heart sounds: Murmur (2/6, holosystolic loudest at the apex.  This is been known to the patient for many years) heard.   Pulmonary:     Effort: No respiratory distress.     Breath sounds: Normal breath sounds.  Abdominal:     General: Bowel sounds are normal.     Palpations: Abdomen is soft. There is no mass.     Tenderness: There is no abdominal tenderness.  Musculoskeletal:     Cervical back: Normal range of motion and neck supple.  Assessment & Plan:   Gregory Evans was seen today for annual exam.  Diagnoses and all orders for this visit:  Well adult exam -     CBC with Differential/Platelet -     ZSW10+XNAT  Apical systolic murmur -     CBC with Differential/Platelet -     CMP14+EGFR  Ventral hernia without obstruction or gangrene  Umbilical hernia without obstruction and without gangrene  Pes planus of both feet  Chronic right shoulder pain  Essential hypertension -     amLODipine (NORVASC) 10 MG tablet; TAKE 1 TABLET (10 MG TOTAL) BY MOUTH DAILY. -     metoprolol tartrate (LOPRESSOR) 100 MG tablet; Take 1 tablet (100 mg total) by mouth 2 (two) times daily. -     CBC with  Differential/Platelet -     CMP14+EGFR -     Urinalysis  Osteoarthritis, unspecified osteoarthritis type, unspecified site -     diclofenac (VOLTAREN) 75 MG EC tablet; Take 1 tablet (75 mg total) by mouth 2 (two) times daily. -     CBC with Differential/Platelet -     CMP14+EGFR -     Urinalysis  Mixed hyperlipidemia -     CBC with Differential/Platelet -     CMP14+EGFR -     Lipid panel  Gastroesophageal reflux disease with esophagitis without hemorrhage -     CBC with Differential/Platelet -     CMP14+EGFR -     Urinalysis  Seasonal allergic rhinitis, unspecified trigger -     CBC with Differential/Platelet -     CMP14+EGFR  NSAID long-term use -     omeprazole (PRILOSEC) 40 MG capsule; Take 1 capsule (40 mg total) by mouth daily before breakfast. -     CBC with Differential/Platelet -     CMP14+EGFR -     Urinalysis  Vitamin D deficiency -     VITAMIN D 25 Hydroxy (Vit-D Deficiency, Fractures)  Screening for prostate cancer -     PSA Total (Reflex To Free)  Other orders -     fexofenadine (ALLEGRA) 180 MG tablet; Take 1 tablet (180 mg total) by mouth daily. For allergy symptoms       I have discontinued Gregory Evans's levocetirizine. I have also changed his diclofenac, metoprolol tartrate, and omeprazole. Additionally, I am having him start on fexofenadine. Lastly, I am having him maintain his aspirin EC, acetaminophen, Muscle Rub, cholecalciferol, sildenafil, and amLODipine.  Allergies as of 08/14/2020      Reactions   Ace Inhibitors    angioedema   Cozaar [losartan Potassium]    Swollen uvula      Medication List       Accurate as of August 14, 2020 10:23 PM. If you have any questions, ask your nurse or doctor.        STOP taking these medications   levocetirizine 5 MG tablet Commonly known as: Xyzal Stopped by: Claretta Fraise, MD     TAKE these medications   acetaminophen 325 MG tablet Commonly known as: TYLENOL Take 650 mg by mouth every 6  (six) hours as needed for moderate pain or headache.   amLODipine 10 MG tablet Commonly known as: NORVASC TAKE 1 TABLET (10 MG TOTAL) BY MOUTH DAILY.   aspirin EC 81 MG tablet Take 81 mg by mouth daily.   cholecalciferol 25 MCG (1000 UNIT) tablet Commonly known as: VITAMIN D3 Take 1,000 Units by mouth daily.   diclofenac 75 MG EC tablet Commonly known as: VOLTAREN Take  1 tablet (75 mg total) by mouth 2 (two) times daily.   fexofenadine 180 MG tablet Commonly known as: ALLEGRA Take 1 tablet (180 mg total) by mouth daily. For allergy symptoms Started by: Claretta Fraise, MD   metoprolol tartrate 100 MG tablet Commonly known as: LOPRESSOR Take 1 tablet (100 mg total) by mouth 2 (two) times daily.   Muscle Rub 10-15 % Crea Apply 1 application topically as needed for muscle pain.   omeprazole 40 MG capsule Commonly known as: PRILOSEC Take 1 capsule (40 mg total) by mouth daily before breakfast. What changed:   medication strength  how much to take Changed by: Claretta Fraise, MD   sildenafil 20 MG tablet Commonly known as: REVATIO Take 20 mg by mouth 3 (three) times daily.        Follow-up: Return in about 6 months (around 02/11/2021), or if symptoms worsen or fail to improve.  Claretta Fraise, M.D.

## 2020-08-15 ENCOUNTER — Other Ambulatory Visit: Payer: Managed Care, Other (non HMO)

## 2020-08-15 LAB — URINALYSIS
Bilirubin, UA: NEGATIVE
Glucose, UA: NEGATIVE
Ketones, UA: NEGATIVE
Leukocytes,UA: NEGATIVE
Nitrite, UA: NEGATIVE
Protein,UA: NEGATIVE
RBC, UA: NEGATIVE
Specific Gravity, UA: 1.02 (ref 1.005–1.030)
Urobilinogen, Ur: 0.2 mg/dL (ref 0.2–1.0)
pH, UA: 5.5 (ref 5.0–7.5)

## 2020-08-16 LAB — CBC WITH DIFFERENTIAL/PLATELET
Basophils Absolute: 0.1 10*3/uL (ref 0.0–0.2)
Basos: 1 %
EOS (ABSOLUTE): 0.5 10*3/uL — ABNORMAL HIGH (ref 0.0–0.4)
Eos: 8 %
Hematocrit: 39.8 % (ref 37.5–51.0)
Hemoglobin: 13.2 g/dL (ref 13.0–17.7)
Immature Grans (Abs): 0 10*3/uL (ref 0.0–0.1)
Immature Granulocytes: 0 %
Lymphocytes Absolute: 1.9 10*3/uL (ref 0.7–3.1)
Lymphs: 32 %
MCH: 30.1 pg (ref 26.6–33.0)
MCHC: 33.2 g/dL (ref 31.5–35.7)
MCV: 91 fL (ref 79–97)
Monocytes Absolute: 0.8 10*3/uL (ref 0.1–0.9)
Monocytes: 14 %
Neutrophils Absolute: 2.5 10*3/uL (ref 1.4–7.0)
Neutrophils: 45 %
Platelets: 219 10*3/uL (ref 150–450)
RBC: 4.39 x10E6/uL (ref 4.14–5.80)
RDW: 10.9 % — ABNORMAL LOW (ref 11.6–15.4)
WBC: 5.8 10*3/uL (ref 3.4–10.8)

## 2020-08-16 LAB — CMP14+EGFR
ALT: 23 IU/L (ref 0–44)
AST: 21 IU/L (ref 0–40)
Albumin/Globulin Ratio: 2 (ref 1.2–2.2)
Albumin: 4.5 g/dL (ref 3.8–4.8)
Alkaline Phosphatase: 46 IU/L (ref 44–121)
BUN/Creatinine Ratio: 15 (ref 10–24)
BUN: 21 mg/dL (ref 8–27)
Bilirubin Total: 0.4 mg/dL (ref 0.0–1.2)
CO2: 23 mmol/L (ref 20–29)
Calcium: 9.5 mg/dL (ref 8.6–10.2)
Chloride: 102 mmol/L (ref 96–106)
Creatinine, Ser: 1.39 mg/dL — ABNORMAL HIGH (ref 0.76–1.27)
GFR calc Af Amer: 61 mL/min/{1.73_m2} (ref >59)
GFR calc non Af Amer: 52 mL/min/{1.73_m2} — ABNORMAL LOW (ref >59)
Globulin, Total: 2.3 g/dL (ref 1.5–4.5)
Glucose: 103 mg/dL — ABNORMAL HIGH (ref 65–99)
Potassium: 4.8 mmol/L (ref 3.5–5.2)
Sodium: 141 mmol/L (ref 134–144)
Total Protein: 6.8 g/dL (ref 6.0–8.5)

## 2020-08-16 LAB — VITAMIN D 25 HYDROXY (VIT D DEFICIENCY, FRACTURES): Vit D, 25-Hydroxy: 32.7 ng/mL (ref 30.0–100.0)

## 2020-08-16 LAB — LIPID PANEL
Chol/HDL Ratio: 3.2 ratio (ref 0.0–5.0)
Cholesterol, Total: 164 mg/dL (ref 100–199)
HDL: 51 mg/dL (ref >39)
LDL Chol Calc (NIH): 92 mg/dL (ref 0–99)
Triglycerides: 117 mg/dL (ref 0–149)
VLDL Cholesterol Cal: 21 mg/dL (ref 5–40)

## 2020-08-16 LAB — FPSA% REFLEX
% FREE PSA: 31.9 %
PSA, FREE: 1.82 ng/mL

## 2020-08-16 LAB — PSA TOTAL (REFLEX TO FREE): Prostate Specific Ag, Serum: 5.7 ng/mL — ABNORMAL HIGH (ref 0.0–4.0)

## 2020-08-21 ENCOUNTER — Other Ambulatory Visit: Payer: Self-pay | Admitting: *Deleted

## 2020-08-21 DIAGNOSIS — R972 Elevated prostate specific antigen [PSA]: Secondary | ICD-10-CM

## 2021-02-18 ENCOUNTER — Other Ambulatory Visit: Payer: Self-pay | Admitting: Family Medicine

## 2021-02-18 DIAGNOSIS — I10 Essential (primary) hypertension: Secondary | ICD-10-CM

## 2021-03-15 ENCOUNTER — Other Ambulatory Visit: Payer: Self-pay | Admitting: Family Medicine

## 2021-03-15 DIAGNOSIS — I10 Essential (primary) hypertension: Secondary | ICD-10-CM

## 2021-04-12 ENCOUNTER — Other Ambulatory Visit: Payer: Self-pay | Admitting: Family Medicine

## 2021-04-12 DIAGNOSIS — I10 Essential (primary) hypertension: Secondary | ICD-10-CM

## 2021-05-23 ENCOUNTER — Other Ambulatory Visit: Payer: Self-pay | Admitting: Family Medicine

## 2021-05-23 DIAGNOSIS — I10 Essential (primary) hypertension: Secondary | ICD-10-CM

## 2021-05-24 ENCOUNTER — Telehealth: Payer: Self-pay | Admitting: Family Medicine

## 2021-05-24 DIAGNOSIS — I10 Essential (primary) hypertension: Secondary | ICD-10-CM

## 2021-05-24 MED ORDER — METOPROLOL TARTRATE 100 MG PO TABS: 100.0000 mg | ORAL_TABLET | Freq: Two times a day (BID) | ORAL | 0 refills | Status: DC

## 2021-05-24 NOTE — Telephone Encounter (Signed)
  Prescription Request  05/24/2021  What is the name of the medication or equipment? Metoprolol  Have you contacted your pharmacy to request a refill? (if applicable) Yes  Which pharmacy would you like this sent to? [facility]  Pt called stating that he is completely out of his Metoprolol Rx that he takes for his BP and needs refill called in to last him at least until his appt to see Dr Livia Snellen on 05/29/21.

## 2021-05-24 NOTE — Telephone Encounter (Signed)
Patient has appointment on 07/13 thirty day supply sent into pharmacy.

## 2021-05-29 ENCOUNTER — Other Ambulatory Visit: Payer: Self-pay

## 2021-05-29 ENCOUNTER — Encounter: Payer: Self-pay | Admitting: Family Medicine

## 2021-05-29 ENCOUNTER — Ambulatory Visit: Payer: Managed Care, Other (non HMO) | Admitting: Family Medicine

## 2021-05-29 VITALS — BP 136/67 | HR 45 | Temp 98.2°F | Ht 71.5 in | Wt 263.0 lb

## 2021-05-29 DIAGNOSIS — Z791 Long term (current) use of non-steroidal anti-inflammatories (NSAID): Secondary | ICD-10-CM | POA: Diagnosis not present

## 2021-05-29 DIAGNOSIS — R972 Elevated prostate specific antigen [PSA]: Secondary | ICD-10-CM

## 2021-05-29 DIAGNOSIS — Z23 Encounter for immunization: Secondary | ICD-10-CM | POA: Diagnosis not present

## 2021-05-29 DIAGNOSIS — M1990 Unspecified osteoarthritis, unspecified site: Secondary | ICD-10-CM

## 2021-05-29 DIAGNOSIS — I10 Essential (primary) hypertension: Secondary | ICD-10-CM | POA: Diagnosis not present

## 2021-05-29 DIAGNOSIS — E782 Mixed hyperlipidemia: Secondary | ICD-10-CM

## 2021-05-29 DIAGNOSIS — E559 Vitamin D deficiency, unspecified: Secondary | ICD-10-CM | POA: Diagnosis not present

## 2021-05-29 MED ORDER — FEXOFENADINE HCL 180 MG PO TABS: 180.0000 mg | ORAL_TABLET | Freq: Every day | ORAL | 3 refills | Status: DC

## 2021-05-29 MED ORDER — METOPROLOL TARTRATE 100 MG PO TABS: 100.0000 mg | ORAL_TABLET | Freq: Two times a day (BID) | ORAL | 3 refills | Status: DC

## 2021-05-29 MED ORDER — AMLODIPINE BESYLATE 10 MG PO TABS: ORAL_TABLET | ORAL | 3 refills | Status: DC

## 2021-05-29 MED ORDER — OMEPRAZOLE 40 MG PO CPDR: 40.0000 mg | DELAYED_RELEASE_CAPSULE | Freq: Every day | ORAL | 3 refills | Status: DC

## 2021-05-29 MED ORDER — DICLOFENAC SODIUM 75 MG PO TBEC: 75.0000 mg | DELAYED_RELEASE_TABLET | Freq: Two times a day (BID) | ORAL | 3 refills | Status: DC

## 2021-05-29 NOTE — Progress Notes (Signed)
Subjective:  Patient ID: Gregory Evans, male    DOB: 1956/08/02  Age: 65 y.o. MRN: [id number]  CC: Medical Management of Chronic Issues   HPI Gregory Evans presents for  follow-up of hypertension. Patient has no history of headache chest pain or shortness of breath or recent cough. Patient also denies symptoms of TIA such as focal numbness or weakness. Patient denies side effects from medication. States taking it regularly.  Not sleeping well. Son killed in [place] pedestrian 2 months ago. Had lost his job and started drinking. He was 65 years old.  He says he thinks about him a lot.  This is keeping him up awake some.  However, he is taking Benadryl 2 pills at bedtime and he is going to add chamomile tea.  These seem to be effective for him.  He does not want a prescription at this time.  He denies depression.  Patient reports that he had a heart catheterization approximately 2004.  This was because of his heart murmur.  Its been present since that time without any symptoms including chest pain shortness of breath and syncope.  Depression screen [facility] 2/9 05/29/2021 08/14/2020 02/08/2020 08/11/2019 02/08/2019  Decreased Interest 0 0 0 0 0  Down, Depressed, Hopeless 0 0 0 0 0  PHQ - 2 Score 0 0 0 0 0     History Gregory Evans has a past medical history of Allergy, Colon polyp (2008), Erectile dysfunction, Hyperlipidemia, Hypertension, Osteoarthritis, and Vitamin D deficiency.   He has a past surgical history that includes Cystectomy and Colonoscopy with propofol (N/A, 12/26/2019).   His family history includes Dementia in his mother; Diabetes (age of onset: 62) in his father; Hypertension in his mother.He reports that he quit smoking about 27 years ago. He has never used smokeless tobacco. He reports current alcohol use. He reports that he does not use drugs.  Current Outpatient Medications on File Prior to Visit  Medication Sig Dispense Refill   acetaminophen (TYLENOL) 325 MG tablet Take 650 mg by mouth  every 6 (six) hours as needed for moderate pain or headache.     aspirin EC 81 MG tablet Take 81 mg by mouth daily.     cholecalciferol (VITAMIN D3) 25 MCG (1000 UNIT) tablet Take 1,000 Units by mouth daily.     Menthol-Methyl Salicylate (MUSCLE RUB) 10-15 % CREA Apply 1 application topically as needed for muscle pain.     sildenafil (REVATIO) 20 MG tablet Take 20 mg by mouth 3 (three) times daily.     [DISCONTINUED] nebivolol (BYSTOLIC) 10 MG tablet Take 2 tablets (20 mg total) by mouth daily. 60 tablet 0   No current facility-administered medications on file prior to visit.    ROS Review of Systems  Constitutional:  Negative for fever.  Respiratory:  Negative for shortness of breath.   Cardiovascular:  Negative for chest pain.  Musculoskeletal:  Negative for arthralgias.  Skin:  Negative for rash.  Psychiatric/Behavioral:  Positive for sleep disturbance. Negative for dysphoric mood.    Objective:  BP 136/67   Pulse (!) 45   Temp 98.2 F (36.8 C)   Ht 5' 11.5" (1.816 m)   Wt 263 lb (119.3 kg)   SpO2 95%   BMI 36.17 kg/m   BP Readings from Last 3 Encounters:  05/29/21 136/67  08/14/20 135/69  02/08/20 128/69    Wt Readings from Last 3 Encounters:  05/29/21 263 lb (119.3 kg)  08/14/20 267 lb (121.1 kg)  02/08/20 271 lb 3.2  oz (123 kg)     Physical Exam Vitals reviewed.  Constitutional:      Appearance: He is well-developed.  HENT:     Head: Normocephalic and atraumatic.     Right Ear: External ear normal.     Left Ear: External ear normal.     Mouth/Throat:     Pharynx: No oropharyngeal exudate or posterior oropharyngeal erythema.  Eyes:     Pupils: Pupils are equal, round, and reactive to light.  Cardiovascular:     Rate and Rhythm: Normal rate and regular rhythm.     Heart sounds: Murmur (2/6 LSB.) heard.  Pulmonary:     Effort: No respiratory distress.     Breath sounds: Normal breath sounds.  Musculoskeletal:     Cervical back: Normal range of motion  and neck supple.  Neurological:     Mental Status: He is alert and oriented to person, place, and time.      Assessment & Plan:   Gregory Evans was seen today for medical management of chronic issues.  Diagnoses and all orders for this visit:  Vitamin D deficiency -     VITAMIN D 25 Hydroxy (Vit-D Deficiency, Fractures)  Essential hypertension -     amLODipine (NORVASC) 10 MG tablet; TAKE 1 TABLET (10 MG TOTAL) BY MOUTH DAILY. -     metoprolol tartrate (LOPRESSOR) 100 MG tablet; Take 1 tablet (100 mg total) by mouth 2 (two) times daily.  Osteoarthritis, unspecified osteoarthritis type, unspecified site -     diclofenac (VOLTAREN) 75 MG EC tablet; Take 1 tablet (75 mg total) by mouth 2 (two) times daily.  NSAID long-term use -     omeprazole (PRILOSEC) 40 MG capsule; Take 1 capsule (40 mg total) by mouth daily before breakfast.  Primary hypertension -     CBC with Differential/Platelet -     CMP14+EGFR  Mixed hyperlipidemia -     Lipid panel  Elevated PSA -     PSA  Other orders -     fexofenadine (ALLEGRA) 180 MG tablet; Take 1 tablet (180 mg total) by mouth daily. For allergy symptoms  Allergies as of 05/29/2021       Reactions   Ace Inhibitors    angioedema   Cozaar [losartan Potassium]    Swollen uvula        Medication List        Accurate as of May 29, 2021  5:00 PM. If you have any questions, ask your nurse or doctor.          acetaminophen 325 MG tablet Commonly known as: TYLENOL Take 650 mg by mouth every 6 (six) hours as needed for moderate pain or headache.   amLODipine 10 MG tablet Commonly known as: NORVASC TAKE 1 TABLET (10 MG TOTAL) BY MOUTH DAILY.   aspirin EC 81 MG tablet Take 81 mg by mouth daily.   cholecalciferol 25 MCG (1000 UNIT) tablet Commonly known as: VITAMIN D3 Take 1,000 Units by mouth daily.   diclofenac 75 MG EC tablet Commonly known as: VOLTAREN Take 1 tablet (75 mg total) by mouth 2 (two) times daily.   fexofenadine  180 MG tablet Commonly known as: ALLEGRA Take 1 tablet (180 mg total) by mouth daily. For allergy symptoms   metoprolol tartrate 100 MG tablet Commonly known as: LOPRESSOR Take 1 tablet (100 mg total) by mouth 2 (two) times daily. What changed: additional instructions Changed by: Claretta Fraise, MD   Muscle Rub 10-15 % Crea Apply 1  application topically as needed for muscle pain.   omeprazole 40 MG capsule Commonly known as: PRILOSEC Take 1 capsule (40 mg total) by mouth daily before breakfast.   sildenafil 20 MG tablet Commonly known as: REVATIO Take 20 mg by mouth 3 (three) times daily.        Meds ordered this encounter  Medications   amLODipine (NORVASC) 10 MG tablet    Sig: TAKE 1 TABLET (10 MG TOTAL) BY MOUTH DAILY.    Dispense:  90 tablet    Refill:  3   diclofenac (VOLTAREN) 75 MG EC tablet    Sig: Take 1 tablet (75 mg total) by mouth 2 (two) times daily.    Dispense:  180 tablet    Refill:  3   fexofenadine (ALLEGRA) 180 MG tablet    Sig: Take 1 tablet (180 mg total) by mouth daily. For allergy symptoms    Dispense:  90 tablet    Refill:  3   metoprolol tartrate (LOPRESSOR) 100 MG tablet    Sig: Take 1 tablet (100 mg total) by mouth 2 (two) times daily.    Dispense:  180 tablet    Refill:  3   omeprazole (PRILOSEC) 40 MG capsule    Sig: Take 1 capsule (40 mg total) by mouth daily before breakfast.    Dispense:  90 capsule    Refill:  3      Follow-up: Return in about 6 months (around 11/29/2021).  Claretta Fraise, M.D.

## 2021-05-29 NOTE — Addendum Note (Signed)
Addended by: Baldomero Lamy B on: 05/29/2021 05:15 PM   Modules accepted: Orders

## 2021-05-30 LAB — PSA: Prostate Specific Ag, Serum: 5.6 ng/mL — ABNORMAL HIGH (ref 0.0–4.0)

## 2021-05-30 LAB — CBC WITH DIFFERENTIAL/PLATELET
Basophils Absolute: 0.1 10*3/uL (ref 0.0–0.2)
Basos: 1 %
EOS (ABSOLUTE): 0.5 10*3/uL — ABNORMAL HIGH (ref 0.0–0.4)
Eos: 8 %
Hematocrit: 35.9 % — ABNORMAL LOW (ref 37.5–51.0)
Hemoglobin: 12.1 g/dL — ABNORMAL LOW (ref 13.0–17.7)
Immature Grans (Abs): 0 10*3/uL (ref 0.0–0.1)
Immature Granulocytes: 0 %
Lymphocytes Absolute: 2.2 10*3/uL (ref 0.7–3.1)
Lymphs: 36 %
MCH: 30.6 pg (ref 26.6–33.0)
MCHC: 33.7 g/dL (ref 31.5–35.7)
MCV: 91 fL (ref 79–97)
Monocytes Absolute: 0.7 10*3/uL (ref 0.1–0.9)
Monocytes: 11 %
Neutrophils Absolute: 2.7 10*3/uL (ref 1.4–7.0)
Neutrophils: 44 %
Platelets: 191 10*3/uL (ref 150–450)
RBC: 3.95 x10E6/uL — ABNORMAL LOW (ref 4.14–5.80)
RDW: 11.6 % (ref 11.6–15.4)
WBC: 6.1 10*3/uL (ref 3.4–10.8)

## 2021-05-30 LAB — CMP14+EGFR
ALT: 27 IU/L (ref 0–44)
AST: 25 IU/L (ref 0–40)
Albumin/Globulin Ratio: 1.9 (ref 1.2–2.2)
Albumin: 4.5 g/dL (ref 3.8–4.8)
Alkaline Phosphatase: 46 IU/L (ref 44–121)
BUN/Creatinine Ratio: 19 (ref 10–24)
BUN: 27 mg/dL (ref 8–27)
Bilirubin Total: 0.7 mg/dL (ref 0.0–1.2)
CO2: 22 mmol/L (ref 20–29)
Calcium: 9.4 mg/dL (ref 8.6–10.2)
Chloride: 103 mmol/L (ref 96–106)
Creatinine, Ser: 1.45 mg/dL — ABNORMAL HIGH (ref 0.76–1.27)
Globulin, Total: 2.4 g/dL (ref 1.5–4.5)
Glucose: 117 mg/dL — ABNORMAL HIGH (ref 65–99)
Potassium: 4.5 mmol/L (ref 3.5–5.2)
Sodium: 142 mmol/L (ref 134–144)
Total Protein: 6.9 g/dL (ref 6.0–8.5)
eGFR: 53 mL/min/{1.73_m2} — ABNORMAL LOW (ref >59)

## 2021-05-30 LAB — LIPID PANEL
Chol/HDL Ratio: 3.3 ratio (ref 0.0–5.0)
Cholesterol, Total: 170 mg/dL (ref 100–199)
HDL: 51 mg/dL (ref >39)
LDL Chol Calc (NIH): 91 mg/dL (ref 0–99)
Triglycerides: 162 mg/dL — ABNORMAL HIGH (ref 0–149)
VLDL Cholesterol Cal: 28 mg/dL (ref 5–40)

## 2021-05-30 LAB — VITAMIN D 25 HYDROXY (VIT D DEFICIENCY, FRACTURES): Vit D, 25-Hydroxy: 35.6 ng/mL (ref 30.0–100.0)

## 2021-06-05 ENCOUNTER — Other Ambulatory Visit: Payer: Self-pay | Admitting: Family Medicine

## 2021-06-05 DIAGNOSIS — R972 Elevated prostate specific antigen [PSA]: Secondary | ICD-10-CM

## 2021-07-25 ENCOUNTER — Other Ambulatory Visit: Payer: Self-pay

## 2021-07-25 ENCOUNTER — Ambulatory Visit (INDEPENDENT_AMBULATORY_CARE_PROVIDER_SITE_OTHER): Payer: Managed Care, Other (non HMO) | Admitting: Family Medicine

## 2021-07-25 ENCOUNTER — Encounter: Payer: Self-pay | Admitting: Family Medicine

## 2021-07-25 VITALS — BP 167/72 | HR 45 | Temp 97.5°F | Ht 71.5 in | Wt 265.0 lb

## 2021-07-25 DIAGNOSIS — M5431 Sciatica, right side: Secondary | ICD-10-CM | POA: Diagnosis not present

## 2021-07-25 MED ORDER — SILDENAFIL CITRATE 20 MG PO TABS: 20.0000 mg | ORAL_TABLET | Freq: Every day | ORAL | 0 refills | Status: DC | PRN

## 2021-07-25 MED ORDER — METHYLPREDNISOLONE ACETATE 80 MG/ML IJ SUSP
80.0000 mg | Freq: Once | INTRAMUSCULAR | Status: AC
  Administered 2021-07-25: 80 mg via INTRAMUSCULAR

## 2021-07-25 NOTE — Progress Notes (Signed)
Assessment & Plan:  1. Right sided sciatica - steroid injection today - educated on being careful taking other NSAIDs with his kidney function  - written education provided on sciatica - methylPREDNISolone acetate (DEPO-MEDROL) injection 80 mg   Follow up plan: Return if symptoms worsen or fail to improve.  Lucile Crater, NP Student   I personally was present during the history, physical exam, and medical decision-making activities of this service and have verified that the service and findings are accurately documented in the nurse practitioner student's note.  Hendricks Limes, MSN, APRN, FNP-C [facility]   Subjective:   Patient ID: Gregory Evans, male    DOB: 02-21-1956, 65 y.o.   MRN: [id number]  HPI: Gregory Evans is a 65 y.o. male presenting on 07/25/2021 for Leg Pain (Right thigh x 5 days)   He states he has been experiencing pain in his right lower buttock that radiates to the posterior thigh for 5-6 days. He has tried ibuprofen and it did not help. He states he had hydrocodone prescribed following a tooth extraction and he never took it for the extraction, but he has used it for this and it has helped some. He has tried Retail [employer] and it helped somewhat. He has had sciatica pain in the past in a similar spot and received a steroid shot that helped.    ROS: Negative unless specifically indicated above in HPI.   Relevant past medical history reviewed and updated as indicated.   Allergies and medications reviewed and updated.   Current Outpatient Medications:    acetaminophen (TYLENOL) 325 MG tablet, Take 650 mg by mouth every 6 (six) hours as needed for moderate pain or headache., Disp: , Rfl:    amLODipine (NORVASC) 10 MG tablet, TAKE 1 TABLET (10 MG TOTAL) BY MOUTH DAILY., Disp: 90 tablet, Rfl: 3   aspirin EC 81 MG tablet, Take 81 mg by mouth daily., Disp: , Rfl:    cholecalciferol (VITAMIN D3) 25 MCG (1000 UNIT) tablet, Take 1,000 Units by mouth  daily., Disp: , Rfl:    diclofenac (VOLTAREN) 75 MG EC tablet, Take 1 tablet (75 mg total) by mouth 2 (two) times daily., Disp: 180 tablet, Rfl: 3   fexofenadine (ALLEGRA) 180 MG tablet, Take 1 tablet (180 mg total) by mouth daily. For allergy symptoms, Disp: 90 tablet, Rfl: 3   Menthol-Methyl Salicylate (MUSCLE RUB) 10-15 % CREA, Apply 1 application topically as needed for muscle pain., Disp: , Rfl:    metoprolol tartrate (LOPRESSOR) 100 MG tablet, Take 1 tablet (100 mg total) by mouth 2 (two) times daily., Disp: 180 tablet, Rfl: 3   omeprazole (PRILOSEC) 40 MG capsule, Take 1 capsule (40 mg total) by mouth daily before breakfast., Disp: 90 capsule, Rfl: 3   sildenafil (REVATIO) 20 MG tablet, Take 1-3 tablets (20-60 mg total) by mouth daily as needed., Disp: 30 tablet, Rfl: 0  Allergies  Allergen Reactions   Ace Inhibitors     angioedema   Cozaar [Losartan Potassium]     Swollen uvula    Objective:   BP (!) 167/72   Pulse (!) 45   Temp (!) 97.5 F (36.4 C) (Temporal)   Ht 5' 11.5" (1.816 m)   Wt 120.2 kg   BMI 36.44 kg/m    Physical Exam Constitutional:      General: He is not in acute distress.    Appearance: Normal appearance. He is obese. He is not ill-appearing, toxic-appearing or diaphoretic.  HENT:  Head: Normocephalic and atraumatic.     Nose: Nose normal.     Mouth/Throat:     Mouth: Mucous membranes are moist.     Pharynx: Oropharynx is clear.  Eyes:     Extraocular Movements: Extraocular movements intact.     Pupils: Pupils are equal, round, and reactive to light.  Cardiovascular:     Rate and Rhythm: Normal rate and regular rhythm.  Pulmonary:     Effort: Pulmonary effort is normal.  Abdominal:     Palpations: Abdomen is soft.  Musculoskeletal:        General: Tenderness (right buttock to posterior thigh) present. No swelling.     Cervical back: Normal range of motion.  Skin:    General: Skin is warm.  Neurological:     General: No focal deficit  present.     Mental Status: He is alert and oriented to person, place, and time. Mental status is at baseline.     Gait: Gait normal.  Psychiatric:        Mood and Affect: Mood normal.        Behavior: Behavior normal.        Thought Content: Thought content normal.        Judgment: Judgment normal.

## 2021-08-04 ENCOUNTER — Other Ambulatory Visit: Payer: Self-pay | Admitting: Family Medicine

## 2021-08-08 ENCOUNTER — Ambulatory Visit: Payer: Managed Care, Other (non HMO) | Admitting: Urology

## 2021-08-22 ENCOUNTER — Other Ambulatory Visit: Payer: Self-pay | Admitting: Family Medicine

## 2021-10-17 ENCOUNTER — Ambulatory Visit: Payer: Managed Care, Other (non HMO) | Admitting: Urology

## 2022-02-14 ENCOUNTER — Other Ambulatory Visit: Payer: Self-pay | Admitting: Family Medicine

## 2022-07-06 ENCOUNTER — Other Ambulatory Visit: Payer: Self-pay | Admitting: Family Medicine

## 2022-07-06 DIAGNOSIS — I10 Essential (primary) hypertension: Secondary | ICD-10-CM

## 2022-07-29 ENCOUNTER — Other Ambulatory Visit: Payer: Self-pay | Admitting: Family Medicine

## 2022-07-29 DIAGNOSIS — I10 Essential (primary) hypertension: Secondary | ICD-10-CM

## 2022-08-06 ENCOUNTER — Ambulatory Visit: Payer: Managed Care, Other (non HMO) | Admitting: Family Medicine

## 2022-08-20 ENCOUNTER — Encounter: Payer: Self-pay | Admitting: Family Medicine

## 2022-08-20 ENCOUNTER — Ambulatory Visit: Payer: BC Managed Care – PPO | Admitting: Family Medicine

## 2022-08-20 VITALS — BP 140/71 | HR 51 | Temp 98.5°F | Ht 71.5 in | Wt 271.0 lb

## 2022-08-20 DIAGNOSIS — I10 Essential (primary) hypertension: Secondary | ICD-10-CM | POA: Diagnosis not present

## 2022-08-20 DIAGNOSIS — M1990 Unspecified osteoarthritis, unspecified site: Secondary | ICD-10-CM

## 2022-08-20 DIAGNOSIS — E782 Mixed hyperlipidemia: Secondary | ICD-10-CM

## 2022-08-20 DIAGNOSIS — E559 Vitamin D deficiency, unspecified: Secondary | ICD-10-CM | POA: Diagnosis not present

## 2022-08-20 DIAGNOSIS — Z23 Encounter for immunization: Secondary | ICD-10-CM | POA: Diagnosis not present

## 2022-08-20 DIAGNOSIS — R972 Elevated prostate specific antigen [PSA]: Secondary | ICD-10-CM

## 2022-08-20 DIAGNOSIS — Z791 Long term (current) use of non-steroidal anti-inflammatories (NSAID): Secondary | ICD-10-CM

## 2022-08-20 DIAGNOSIS — J301 Allergic rhinitis due to pollen: Secondary | ICD-10-CM

## 2022-08-20 MED ORDER — LEVOCETIRIZINE DIHYDROCHLORIDE 5 MG PO TABS: 5.0000 mg | ORAL_TABLET | Freq: Every evening | ORAL | 3 refills | Status: DC

## 2022-08-20 MED ORDER — AMLODIPINE BESYLATE 10 MG PO TABS: ORAL_TABLET | ORAL | 3 refills | Status: DC

## 2022-08-20 MED ORDER — METOPROLOL TARTRATE 25 MG PO TABS: 25.0000 mg | ORAL_TABLET | Freq: Two times a day (BID) | ORAL | 3 refills | Status: DC

## 2022-08-20 MED ORDER — BETAMETHASONE SOD PHOS & ACET 6 (3-3) MG/ML IJ SUSP
6.0000 mg | Freq: Once | INTRAMUSCULAR | Status: AC
  Administered 2022-08-20: 6 mg via INTRAMUSCULAR

## 2022-08-20 MED ORDER — SILDENAFIL CITRATE 20 MG PO TABS: 20.0000 mg | ORAL_TABLET | Freq: Every day | ORAL | 5 refills | Status: DC | PRN

## 2022-08-20 MED ORDER — OMEPRAZOLE 40 MG PO CPDR: 40.0000 mg | DELAYED_RELEASE_CAPSULE | Freq: Every day | ORAL | 3 refills | Status: DC

## 2022-08-20 MED ORDER — CHLORTHALIDONE 25 MG PO TABS: 25.0000 mg | ORAL_TABLET | Freq: Every day | ORAL | 3 refills | Status: DC

## 2022-08-20 NOTE — Progress Notes (Signed)
Subjective:  Patient ID: Gregory Evans, male    DOB: 1956-05-07  Age: 66 y.o. MRN: [id number]  CC: Medical Management of Chronic Issues   HPI Gregory Evans presents for  follow-up of hypertension. Patient has no history of headache chest pain or shortness of breath or recent cough. Patient also denies symptoms of TIA such as focal numbness or weakness. Patient denies side effects from medication. States taking it regularly.  Allergies flaring.    History Gregory Evans has a past medical history of Allergy, Colon polyp (2008), Erectile dysfunction, Hyperlipidemia, Hypertension, Osteoarthritis, and Vitamin D deficiency.   He has a past surgical history that includes Cystectomy; Colonoscopy with propofol (N/A, 12/26/2019); and Hernia repair.   His family history includes Dementia in his mother; Diabetes (age of onset: 27) in his father; Hypertension in his mother.He reports that he quit smoking about 29 years ago. His smoking use included cigarettes. He has never used smokeless tobacco. He reports current alcohol use. He reports that he does not use drugs.  Current Outpatient Medications on File Prior to Visit  Medication Sig Dispense Refill   acetaminophen (TYLENOL) 325 MG tablet Take 650 mg by mouth every 6 (six) hours as needed for moderate pain or headache.     aspirin EC 81 MG tablet Take 81 mg by mouth daily.     cholecalciferol (VITAMIN D3) 25 MCG (1000 UNIT) tablet Take 1,000 Units by mouth daily.     diclofenac (VOLTAREN) 75 MG EC tablet Take 1 tablet (75 mg total) by mouth 2 (two) times daily. 180 tablet 3   Menthol-Methyl Salicylate (MUSCLE RUB) 10-15 % CREA Apply 1 application topically as needed for muscle pain.     metoprolol tartrate (LOPRESSOR) 25 MG tablet Take 25 mg by mouth 2 (two) times daily.     [DISCONTINUED] nebivolol (BYSTOLIC) 10 MG tablet Take 2 tablets (20 mg total) by mouth daily. 60 tablet 0   No current facility-administered medications on file prior to visit.     ROS Review of Systems  Constitutional:  Negative for activity change, appetite change, chills and fever.  HENT:  Positive for congestion, postnasal drip and rhinorrhea. Negative for ear discharge, ear pain, hearing loss, nosebleeds, sinus pressure, sneezing and trouble swallowing.   Respiratory:  Negative for chest tightness and shortness of breath.   Cardiovascular:  Negative for chest pain and palpitations.  Skin:  Negative for rash.    Objective:  BP (!) 140/71   Pulse (!) 51   Temp 98.5 F (36.9 C)   Ht 5' 11.5" (1.816 m)   Wt 271 lb (122.9 kg)   SpO2 95%   BMI 37.27 kg/m   BP Readings from Last 3 Encounters:  08/20/22 (!) 140/71  07/25/21 (!) 167/72  05/29/21 136/67    Wt Readings from Last 3 Encounters:  08/20/22 271 lb (122.9 kg)  07/25/21 265 lb (120.2 kg)  05/29/21 263 lb (119.3 kg)     Physical Exam Vitals reviewed.  Constitutional:      Appearance: He is well-developed.  HENT:     Head: Normocephalic and atraumatic.     Right Ear: External ear normal.     Left Ear: External ear normal.     Mouth/Throat:     Pharynx: No oropharyngeal exudate or posterior oropharyngeal erythema.  Eyes:     Pupils: Pupils are equal, round, and reactive to light.  Cardiovascular:     Rate and Rhythm: Normal rate and regular rhythm.     Heart  sounds: No murmur heard. Pulmonary:     Effort: No respiratory distress.     Breath sounds: Normal breath sounds.  Musculoskeletal:     Cervical back: Normal range of motion and neck supple.  Neurological:     Mental Status: He is alert and oriented to person, place, and time.       Assessment & Plan:   Gregory Evans was seen today for medical management of chronic issues.  Diagnoses and all orders for this visit:  Primary hypertension -     CBC with Differential/Platelet -     CMP14+EGFR  Vitamin D deficiency -     VITAMIN D 25 Hydroxy (Vit-D Deficiency, Fractures)  Mixed hyperlipidemia -     Lipid panel  Elevated  PSA -     PSA, total and free  Essential hypertension -     amLODipine (NORVASC) 10 MG tablet; TAKE 1 TABLET BY MOUTH EVERY DAY (NEEDS TO BE SEEN BEFORE NEXT REFILL) -     metoprolol tartrate (LOPRESSOR) 25 MG tablet; Take 1 tablet (25 mg total) by mouth 2 (two) times daily.  NSAID long-term use -     omeprazole (PRILOSEC) 40 MG capsule; Take 1 capsule (40 mg total) by mouth daily before breakfast.  Need for influenza vaccination -     Flu Vaccine QUAD High Dose(Fluad)  Need for shingles vaccine -     Zoster Recombinant (Shingrix )  Osteoarthritis, unspecified osteoarthritis type, unspecified site -     betamethasone acetate-betamethasone sodium phosphate (CELESTONE) injection 6 mg  Seasonal allergic rhinitis due to pollen  Other orders -     chlorthalidone (HYGROTON) 25 MG tablet; Take 1 tablet (25 mg total) by mouth daily. -     sildenafil (REVATIO) 20 MG tablet; Take 1-5 tablets (20-100 mg total) by mouth daily as needed. -     levocetirizine (XYZAL) 5 MG tablet; Take 1 tablet (5 mg total) by mouth every evening.   Allergies as of 08/20/2022       Reactions   Ace Inhibitors    angioedema   Cozaar [losartan Potassium]    Swollen uvula        Medication List        Accurate as of August 20, 2022  6:31 PM. If you have any questions, ask your nurse or doctor.          STOP taking these medications    Allergy Relief 180 MG tablet Generic drug: fexofenadine Stopped by: Claretta Fraise, MD       TAKE these medications    acetaminophen 325 MG tablet Commonly known as: TYLENOL Take 650 mg by mouth every 6 (six) hours as needed for moderate pain or headache.   amLODipine 10 MG tablet Commonly known as: NORVASC TAKE 1 TABLET BY MOUTH EVERY DAY (NEEDS TO BE SEEN BEFORE NEXT REFILL)   aspirin EC 81 MG tablet Take 81 mg by mouth daily.   chlorthalidone 25 MG tablet Commonly known as: HYGROTON Take 1 tablet (25 mg total) by mouth daily. Started by: Claretta Fraise, MD   cholecalciferol 25 MCG (1000 UNIT) tablet Commonly known as: VITAMIN D3 Take 1,000 Units by mouth daily.   diclofenac 75 MG EC tablet Commonly known as: VOLTAREN Take 1 tablet (75 mg total) by mouth 2 (two) times daily.   levocetirizine 5 MG tablet Commonly known as: XYZAL Take 1 tablet (5 mg total) by mouth every evening. Started by: Claretta Fraise, MD   metoprolol tartrate 25 MG  tablet Commonly known as: LOPRESSOR Take 25 mg by mouth 2 (two) times daily. What changed: Another medication with the same name was changed. Make sure you understand how and when to take each. Changed by: Claretta Fraise, MD   metoprolol tartrate 25 MG tablet Commonly known as: LOPRESSOR Take 1 tablet (25 mg total) by mouth 2 (two) times daily. What changed:  medication strength how much to take Changed by: Claretta Fraise, MD   Muscle Rub 10-15 % Crea Apply 1 application topically as needed for muscle pain.   omeprazole 40 MG capsule Commonly known as: PRILOSEC Take 1 capsule (40 mg total) by mouth daily before breakfast.   sildenafil 20 MG tablet Commonly known as: REVATIO Take 1-5 tablets (20-100 mg total) by mouth daily as needed. What changed: how much to take Changed by: Claretta Fraise, MD        Meds ordered this encounter  Medications   amLODipine (NORVASC) 10 MG tablet    Sig: TAKE 1 TABLET BY MOUTH EVERY DAY (NEEDS TO BE SEEN BEFORE NEXT REFILL)    Dispense:  90 tablet    Refill:  3   metoprolol tartrate (LOPRESSOR) 25 MG tablet    Sig: Take 1 tablet (25 mg total) by mouth 2 (two) times daily.    Dispense:  180 tablet    Refill:  3   omeprazole (PRILOSEC) 40 MG capsule    Sig: Take 1 capsule (40 mg total) by mouth daily before breakfast.    Dispense:  90 capsule    Refill:  3   betamethasone acetate-betamethasone sodium phosphate (CELESTONE) injection 6 mg   chlorthalidone (HYGROTON) 25 MG tablet    Sig: Take 1 tablet (25 mg total) by mouth daily.    Dispense:   90 tablet    Refill:  3   sildenafil (REVATIO) 20 MG tablet    Sig: Take 1-5 tablets (20-100 mg total) by mouth daily as needed.    Dispense:  50 tablet    Refill:  5   levocetirizine (XYZAL) 5 MG tablet    Sig: Take 1 tablet (5 mg total) by mouth every evening.    Dispense:  90 tablet    Refill:  3      Follow-up: Return in about 6 months (around 02/19/2023).  Claretta Fraise, M.D.

## 2022-08-21 LAB — CBC WITH DIFFERENTIAL/PLATELET
Basophils Absolute: 0.1 10*3/uL (ref 0.0–0.2)
Basos: 1 %
EOS (ABSOLUTE): 0.4 10*3/uL (ref 0.0–0.4)
Eos: 7 %
Hematocrit: 37.6 % (ref 37.5–51.0)
Hemoglobin: 12.8 g/dL — ABNORMAL LOW (ref 13.0–17.7)
Immature Grans (Abs): 0 10*3/uL (ref 0.0–0.1)
Immature Granulocytes: 0 %
Lymphocytes Absolute: 1.9 10*3/uL (ref 0.7–3.1)
Lymphs: 32 %
MCH: 30.8 pg (ref 26.6–33.0)
MCHC: 34 g/dL (ref 31.5–35.7)
MCV: 90 fL (ref 79–97)
Monocytes Absolute: 0.8 10*3/uL (ref 0.1–0.9)
Monocytes: 13 %
Neutrophils Absolute: 2.9 10*3/uL (ref 1.4–7.0)
Neutrophils: 47 %
Platelets: 214 10*3/uL (ref 150–450)
RBC: 4.16 x10E6/uL (ref 4.14–5.80)
RDW: 11.5 % — ABNORMAL LOW (ref 11.6–15.4)
WBC: 6.1 10*3/uL (ref 3.4–10.8)

## 2022-08-21 LAB — VITAMIN D 25 HYDROXY (VIT D DEFICIENCY, FRACTURES): Vit D, 25-Hydroxy: 48.5 ng/mL (ref 30.0–100.0)

## 2022-08-21 LAB — CMP14+EGFR
ALT: 28 IU/L (ref 0–44)
AST: 28 IU/L (ref 0–40)
Albumin/Globulin Ratio: 1.7 (ref 1.2–2.2)
Albumin: 4.5 g/dL (ref 3.9–4.9)
Alkaline Phosphatase: 49 IU/L (ref 44–121)
BUN/Creatinine Ratio: 16 (ref 10–24)
BUN: 18 mg/dL (ref 8–27)
Bilirubin Total: 0.6 mg/dL (ref 0.0–1.2)
CO2: 22 mmol/L (ref 20–29)
Calcium: 9.7 mg/dL (ref 8.6–10.2)
Chloride: 102 mmol/L (ref 96–106)
Creatinine, Ser: 1.13 mg/dL (ref 0.76–1.27)
Globulin, Total: 2.7 g/dL (ref 1.5–4.5)
Glucose: 95 mg/dL (ref 70–99)
Potassium: 4.1 mmol/L (ref 3.5–5.2)
Sodium: 142 mmol/L (ref 134–144)
Total Protein: 7.2 g/dL (ref 6.0–8.5)
eGFR: 71 mL/min/{1.73_m2} (ref >59)

## 2022-08-21 LAB — PSA, TOTAL AND FREE
PSA, Free Pct: 33 %
PSA, Free: 1.85 ng/mL
Prostate Specific Ag, Serum: 5.6 ng/mL — ABNORMAL HIGH (ref 0.0–4.0)

## 2022-08-21 LAB — LIPID PANEL
Chol/HDL Ratio: 3.3 ratio (ref 0.0–5.0)
Cholesterol, Total: 186 mg/dL (ref 100–199)
HDL: 57 mg/dL (ref >39)
LDL Chol Calc (NIH): 115 mg/dL — ABNORMAL HIGH (ref 0–99)
Triglycerides: 78 mg/dL (ref 0–149)
VLDL Cholesterol Cal: 14 mg/dL (ref 5–40)

## 2023-02-19 ENCOUNTER — Encounter: Payer: Self-pay | Admitting: Family Medicine

## 2023-02-19 ENCOUNTER — Ambulatory Visit: Payer: BC Managed Care – PPO | Admitting: Family Medicine

## 2023-02-19 VITALS — BP 131/65 | HR 58 | Temp 98.1°F | Ht 71.5 in | Wt 265.4 lb

## 2023-02-19 DIAGNOSIS — I10 Essential (primary) hypertension: Secondary | ICD-10-CM

## 2023-02-19 DIAGNOSIS — E782 Mixed hyperlipidemia: Secondary | ICD-10-CM

## 2023-02-19 DIAGNOSIS — M1990 Unspecified osteoarthritis, unspecified site: Secondary | ICD-10-CM | POA: Diagnosis not present

## 2023-02-19 MED ORDER — CIPROFLOXACIN HCL 500 MG PO TABS
500.0000 mg | ORAL_TABLET | Freq: Two times a day (BID) | ORAL | 0 refills | Status: DC
Start: 2023-02-19 — End: 2023-09-22

## 2023-02-19 MED ORDER — PREGABALIN 50 MG PO CAPS: ORAL_CAPSULE | ORAL | 0 refills | Status: DC

## 2023-02-19 NOTE — Progress Notes (Signed)
Subjective:  Patient ID: Gregory Evans, male    DOB: 10-23-1956  Age: 67 y.o. MRN: [id number]  CC: Medical Management of Chronic Issues   HPI Gregory Evans presents for  follow-up of hypertension. Patient has no history of headache chest pain or shortness of breath or recent cough. Patient also denies symptoms of TIA such as focal numbness or weakness. Patient denies side effects from medication. States taking it regularly.  Burning at lateral right thigh is worse. Now occurring at left thigh also. Stopped taking tumeric and symptom improved. Went from 3-4 times a day to one occurrence daily. Lasts 1-2 minutes. Is 10/10 for severity  Patient in for follow-up of GERD. Currently asymptomatic taking  PPI daily. There is no chest pain or heartburn. No hematemesis and no melena. No dysphagia or choking. Onset is remote. Progression is stable. Complicating factors, none.Tapered the omeprazole to 20 mg once a day.     has a past medical history of Allergy, Colon polyp (2008), Erectile dysfunction, Hyperlipidemia, Hypertension, Osteoarthritis, and Vitamin D deficiency. History Gregory Evans   He has a past surgical history that includes Cystectomy; Colonoscopy with propofol (N/A, 12/26/2019); and Hernia repair.   His family history includes Dementia in his mother; Diabetes (age of onset: 32) in his father; Hypertension in his mother.He reports that he quit smoking about 29 years ago. His smoking use included cigarettes. He has never used smokeless tobacco. He reports current alcohol use. He reports that he does not use drugs.  Current Outpatient Medications on File Prior to Visit  Medication Sig Dispense Refill   acetaminophen (TYLENOL) 325 MG tablet Take 650 mg by mouth every 6 (six) hours as needed for moderate pain or headache.     amLODipine (NORVASC) 10 MG tablet TAKE 1 TABLET BY MOUTH EVERY DAY (NEEDS TO BE SEEN BEFORE NEXT REFILL) 90 tablet 3   aspirin EC 81 MG tablet Take 81 mg by mouth daily.      chlorthalidone (HYGROTON) 25 MG tablet Take 1 tablet (25 mg total) by mouth daily. 90 tablet 3   cholecalciferol (VITAMIN D3) 25 MCG (1000 UNIT) tablet Take 1,000 Units by mouth daily.     levocetirizine (XYZAL) 5 MG tablet Take 1 tablet (5 mg total) by mouth every evening. 90 tablet 3   Menthol-Methyl Salicylate (MUSCLE RUB) 10-15 % CREA Apply 1 application topically as needed for muscle pain.     metoprolol tartrate (LOPRESSOR) 25 MG tablet Take 1 tablet (25 mg total) by mouth 2 (two) times daily. 180 tablet 3   metoprolol tartrate (LOPRESSOR) 25 MG tablet Take 25 mg by mouth 2 (two) times daily.     omeprazole (PRILOSEC) 40 MG capsule Take 1 capsule (40 mg total) by mouth daily before breakfast. 90 capsule 3   sildenafil (REVATIO) 20 MG tablet Take 1-5 tablets (20-100 mg total) by mouth daily as needed. 50 tablet 5   [DISCONTINUED] nebivolol (BYSTOLIC) 10 MG tablet Take 2 tablets (20 mg total) by mouth daily. 60 tablet 0   No current facility-administered medications on file prior to visit.    ROS Review of Systems  Constitutional:  Negative for fever.  Respiratory:  Negative for shortness of breath.   Cardiovascular:  Negative for chest pain.  Musculoskeletal:  Negative for arthralgias.  Skin:  Negative for rash.    Objective:  BP 131/65   Pulse (!) 58   Temp 98.1 F (36.7 C)   Ht 5' 11.5" (1.816 m)   Wt 265 lb 6.4  oz (120.4 kg)   SpO2 97%   BMI 36.50 kg/m   BP Readings from Last 3 Encounters:  02/19/23 131/65  08/20/22 (!) 140/71  07/25/21 (!) 167/72    Wt Readings from Last 3 Encounters:  02/19/23 265 lb 6.4 oz (120.4 kg)  08/20/22 271 lb (122.9 kg)  07/25/21 265 lb (120.2 kg)     Physical Exam    Assessment & Plan:   Worden was seen today for medical management of chronic issues.  Diagnoses and all orders for this visit:  Primary hypertension -     CBC with Differential/Platelet -     CMP14+EGFR  Mixed hyperlipidemia -     Cancel: Lipid  panel  Osteoarthritis, unspecified osteoarthritis type, unspecified site  Other orders -     pregabalin (LYRICA) 50 MG capsule; 1 qhs X7 days , then 2 qhs X 7d, then 3 qhs X 7d, then 4 qhs -     ciprofloxacin (CIPRO) 500 MG tablet; Take 1 tablet (500 mg total) by mouth 2 (two) times daily.   Allergies as of 02/19/2023       Reactions   Ace Inhibitors    angioedema   Cozaar [losartan Potassium]    Swollen uvula        Medication List        Accurate as of February 19, 2023  4:35 PM. If you have any questions, ask your nurse or doctor.          STOP taking these medications    diclofenac 75 MG EC tablet Commonly known as: VOLTAREN Stopped by: Claretta Fraise, MD       TAKE these medications    acetaminophen 325 MG tablet Commonly known as: TYLENOL Take 650 mg by mouth every 6 (six) hours as needed for moderate pain or headache.   amLODipine 10 MG tablet Commonly known as: NORVASC TAKE 1 TABLET BY MOUTH EVERY DAY (NEEDS TO BE SEEN BEFORE NEXT REFILL)   aspirin EC 81 MG tablet Take 81 mg by mouth daily.   chlorthalidone 25 MG tablet Commonly known as: HYGROTON Take 1 tablet (25 mg total) by mouth daily.   cholecalciferol 25 MCG (1000 UNIT) tablet Commonly known as: VITAMIN D3 Take 1,000 Units by mouth daily.   ciprofloxacin 500 MG tablet Commonly known as: Cipro Take 1 tablet (500 mg total) by mouth 2 (two) times daily. Started by: Claretta Fraise, MD   levocetirizine 5 MG tablet Commonly known as: XYZAL Take 1 tablet (5 mg total) by mouth every evening.   metoprolol tartrate 25 MG tablet Commonly known as: LOPRESSOR Take 25 mg by mouth 2 (two) times daily.   metoprolol tartrate 25 MG tablet Commonly known as: LOPRESSOR Take 1 tablet (25 mg total) by mouth 2 (two) times daily.   Muscle Rub 10-15 % Crea Apply 1 application topically as needed for muscle pain.   omeprazole 40 MG capsule Commonly known as: PRILOSEC Take 1 capsule (40 mg total) by mouth  daily before breakfast.   pregabalin 50 MG capsule Commonly known as: Lyrica 1 qhs X7 days , then 2 qhs X 7d, then 3 qhs X 7d, then 4 qhs Started by: Claretta Fraise, MD   sildenafil 20 MG tablet Commonly known as: REVATIO Take 1-5 tablets (20-100 mg total) by mouth daily as needed.        Meds ordered this encounter  Medications   pregabalin (LYRICA) 50 MG capsule    Sig: 1 qhs X7 days , then  2 qhs X 7d, then 3 qhs X 7d, then 4 qhs    Dispense:  120 capsule    Refill:  0   ciprofloxacin (CIPRO) 500 MG tablet    Sig: Take 1 tablet (500 mg total) by mouth 2 (two) times daily.    Dispense:  30 tablet    Refill:  0      Follow-up: Return in about 6 months (around 08/21/2023) for [facility] physical.  Claretta Fraise, M.D.

## 2023-02-20 LAB — CMP14+EGFR
ALT: 38 IU/L (ref 0–44)
AST: 27 IU/L (ref 0–40)
Albumin/Globulin Ratio: 1.6 (ref 1.2–2.2)
Albumin: 4.5 g/dL (ref 3.9–4.9)
Alkaline Phosphatase: 42 IU/L — ABNORMAL LOW (ref 44–121)
BUN/Creatinine Ratio: 17 (ref 10–24)
BUN: 22 mg/dL (ref 8–27)
Bilirubin Total: 0.6 mg/dL (ref 0.0–1.2)
CO2: 24 mmol/L (ref 20–29)
Calcium: 9.8 mg/dL (ref 8.6–10.2)
Chloride: 97 mmol/L (ref 96–106)
Creatinine, Ser: 1.28 mg/dL — ABNORMAL HIGH (ref 0.76–1.27)
Globulin, Total: 2.8 g/dL (ref 1.5–4.5)
Glucose: 96 mg/dL (ref 70–99)
Potassium: 3.5 mmol/L (ref 3.5–5.2)
Sodium: 139 mmol/L (ref 134–144)
Total Protein: 7.3 g/dL (ref 6.0–8.5)
eGFR: 61 mL/min/{1.73_m2} (ref >59)

## 2023-02-20 LAB — CBC WITH DIFFERENTIAL/PLATELET
Basophils Absolute: 0.1 10*3/uL (ref 0.0–0.2)
Basos: 1 %
EOS (ABSOLUTE): 0.4 10*3/uL (ref 0.0–0.4)
Eos: 6 %
Hematocrit: 38.7 % (ref 37.5–51.0)
Hemoglobin: 12.8 g/dL — ABNORMAL LOW (ref 13.0–17.7)
Immature Grans (Abs): 0 10*3/uL (ref 0.0–0.1)
Immature Granulocytes: 0 %
Lymphocytes Absolute: 1.8 10*3/uL (ref 0.7–3.1)
Lymphs: 29 %
MCH: 30 pg (ref 26.6–33.0)
MCHC: 33.1 g/dL (ref 31.5–35.7)
MCV: 91 fL (ref 79–97)
Monocytes Absolute: 0.8 10*3/uL (ref 0.1–0.9)
Monocytes: 13 %
Neutrophils Absolute: 3.1 10*3/uL (ref 1.4–7.0)
Neutrophils: 51 %
Platelets: 232 10*3/uL (ref 150–450)
RBC: 4.27 x10E6/uL (ref 4.14–5.80)
RDW: 11.7 % (ref 11.6–15.4)
WBC: 6.1 10*3/uL (ref 3.4–10.8)

## 2023-02-23 NOTE — Progress Notes (Signed)
Hello  Chord,    Your lab result is normal and/or stable.Some minor variations that are not significant are commonly marked abnormal, but do not represent any medical problem for you.   Best regards,  Kristian Mogg, M.D.

## 2023-06-15 ENCOUNTER — Other Ambulatory Visit: Payer: Self-pay | Admitting: Family Medicine

## 2023-08-25 ENCOUNTER — Encounter: Payer: BC Managed Care – PPO | Admitting: Family Medicine

## 2023-08-28 ENCOUNTER — Other Ambulatory Visit: Payer: Self-pay | Admitting: Family Medicine

## 2023-08-31 NOTE — Telephone Encounter (Signed)
Last office visit 02/19/23 Last refill 08/20/2022, #50, 5 refills

## 2023-09-17 ENCOUNTER — Other Ambulatory Visit: Payer: Self-pay | Admitting: Family Medicine

## 2023-09-17 DIAGNOSIS — I10 Essential (primary) hypertension: Secondary | ICD-10-CM

## 2023-09-22 ENCOUNTER — Encounter: Payer: Self-pay | Admitting: Family Medicine

## 2023-09-22 ENCOUNTER — Ambulatory Visit (INDEPENDENT_AMBULATORY_CARE_PROVIDER_SITE_OTHER): Payer: BC Managed Care – PPO | Admitting: Family Medicine

## 2023-09-22 VITALS — BP 132/73 | HR 63 | Temp 98.1°F | Ht 71.5 in | Wt 278.8 lb

## 2023-09-22 DIAGNOSIS — E782 Mixed hyperlipidemia: Secondary | ICD-10-CM

## 2023-09-22 DIAGNOSIS — R972 Elevated prostate specific antigen [PSA]: Secondary | ICD-10-CM

## 2023-09-22 DIAGNOSIS — E559 Vitamin D deficiency, unspecified: Secondary | ICD-10-CM | POA: Diagnosis not present

## 2023-09-22 DIAGNOSIS — I10 Essential (primary) hypertension: Secondary | ICD-10-CM | POA: Diagnosis not present

## 2023-09-22 DIAGNOSIS — M1990 Unspecified osteoarthritis, unspecified site: Secondary | ICD-10-CM

## 2023-09-22 DIAGNOSIS — Z791 Long term (current) use of non-steroidal anti-inflammatories (NSAID): Secondary | ICD-10-CM

## 2023-09-22 DIAGNOSIS — Z23 Encounter for immunization: Secondary | ICD-10-CM

## 2023-09-22 MED ORDER — OMEPRAZOLE 40 MG PO CPDR: 40.0000 mg | DELAYED_RELEASE_CAPSULE | Freq: Every day | ORAL | 3 refills | Status: DC

## 2023-09-22 MED ORDER — CELECOXIB 100 MG PO CAPS: 100.0000 mg | ORAL_CAPSULE | Freq: Every day | ORAL | 1 refills | Status: DC

## 2023-09-22 MED ORDER — BETAMETHASONE SOD PHOS & ACET 6 (3-3) MG/ML IJ SUSP: 6.0000 mg | Freq: Once | INTRAMUSCULAR | Status: DC

## 2023-09-22 MED ORDER — CHLORTHALIDONE 25 MG PO TABS: 25.0000 mg | ORAL_TABLET | Freq: Every day | ORAL | 3 refills | Status: DC

## 2023-09-22 MED ORDER — AMLODIPINE BESYLATE 10 MG PO TABS: ORAL_TABLET | ORAL | 3 refills | Status: DC

## 2023-09-22 MED ORDER — LEVOCETIRIZINE DIHYDROCHLORIDE 5 MG PO TABS: 5.0000 mg | ORAL_TABLET | Freq: Every evening | ORAL | 3 refills | Status: DC

## 2023-09-22 MED ORDER — METOPROLOL TARTRATE 25 MG PO TABS: 25.0000 mg | ORAL_TABLET | Freq: Two times a day (BID) | ORAL | 3 refills | Status: DC

## 2023-09-22 NOTE — Addendum Note (Signed)
Addended by: Mechele Claude on: 09/22/2023 04:21 PM   Modules accepted: Orders

## 2023-09-22 NOTE — Addendum Note (Signed)
Addended by: Adella Hare B on: 09/22/2023 04:35 PM   Modules accepted: Orders

## 2023-09-22 NOTE — Progress Notes (Addendum)
Subjective:  Patient ID: Gregory Evans, male    DOB: Jan 10, 1956  Age: 67 y.o. MRN: [id number]  CC: Annual Exam   HPI Gregory Evans presents for Annual CPE       09/22/2023    3:40 PM 09/22/2023    3:27 PM 02/19/2023    3:42 PM  Depression screen PHQ 2/9  Decreased Interest 0 0 0  Down, Depressed, Hopeless 0 0 0  PHQ - 2 Score 0 0 0  Altered sleeping 3    Tired, decreased energy 1    Change in appetite 0    Feeling bad or failure about yourself  0    Trouble concentrating 0    Moving slowly or fidgety/restless 0    Suicidal thoughts 0    PHQ-9 Score 4    Difficult doing work/chores Not difficult at all      History Gregory Evans has a past medical history of Allergy, Colon polyp (2008), Erectile dysfunction, Hyperlipidemia, Hypertension, Osteoarthritis, and Vitamin D deficiency.   He has a past surgical history that includes Cystectomy; Colonoscopy with propofol (N/A, 12/26/2019); and Hernia repair.   His family history includes Dementia in his mother; Diabetes (age of onset: 61) in his father; Hypertension in his mother.He reports that he quit smoking about 30 years ago. His smoking use included cigarettes. He has never used smokeless tobacco. He reports current alcohol use. He reports that he does not use drugs.    ROS Review of Systems  Constitutional:  Negative for activity change, fatigue, fever and unexpected weight change.  HENT:  Negative for congestion, ear pain, hearing loss, postnasal drip and trouble swallowing.   Eyes:  Negative for pain and visual disturbance.  Respiratory:  Negative for cough, chest tightness and shortness of breath.   Cardiovascular:  Negative for chest pain, palpitations and leg swelling.  Gastrointestinal:  Negative for abdominal distention, abdominal pain, blood in stool, constipation, diarrhea, nausea and vomiting.  Endocrine: Negative for cold intolerance, heat intolerance and polydipsia.  Genitourinary:  Negative for difficulty urinating,  dysuria, flank pain, frequency and urgency.  Musculoskeletal:  Positive for arthralgias (lwft shoulder, both knees, left ankle). Negative for joint swelling.  Skin:  Negative for color change, rash and wound.  Neurological:  Negative for dizziness, syncope, speech difficulty, weakness, light-headedness, numbness and headaches.  Hematological:  Does not bruise/bleed easily.  Psychiatric/Behavioral:  Positive for sleep disturbance (trouble getting to sleep and staying asleep.). Negative for confusion, decreased concentration and dysphoric mood. The patient is not nervous/anxious.     Objective:  BP 132/73   Pulse 63   Temp 98.1 F (36.7 C)   Ht 5' 11.5" (1.816 m)   Wt 278 lb 12.8 oz (126.5 kg)   SpO2 95%   BMI 38.34 kg/m   BP Readings from Last 3 Encounters:  09/22/23 132/73  02/19/23 131/65  08/20/22 (!) 140/71    Wt Readings from Last 3 Encounters:  09/22/23 278 lb 12.8 oz (126.5 kg)  02/19/23 265 lb 6.4 oz (120.4 kg)  08/20/22 271 lb (122.9 kg)     Physical Exam Vitals reviewed.  Constitutional:      Appearance: He is well-developed.  HENT:     Head: Normocephalic and atraumatic.     Right Ear: External ear normal.     Left Ear: External ear normal.     Mouth/Throat:     Pharynx: No oropharyngeal exudate or posterior oropharyngeal erythema.  Eyes:     Pupils: Pupils are equal, round, and  reactive to light.  Neck:     Thyroid: No thyromegaly.     Trachea: No tracheal deviation.  Cardiovascular:     Rate and Rhythm: Normal rate and regular rhythm.     Heart sounds: Normal heart sounds. No murmur heard.    No friction rub. No gallop.  Pulmonary:     Effort: No respiratory distress.     Breath sounds: Normal breath sounds. No wheezing or rales.  Abdominal:     General: Bowel sounds are normal. There is no distension.     Palpations: Abdomen is soft. There is no mass.     Tenderness: There is no abdominal tenderness.     Hernia: There is no hernia in the left  inguinal area.  Genitourinary:    Penis: Normal.      Testes: Normal.  Musculoskeletal:        General: Normal range of motion.     Cervical back: Normal range of motion and neck supple.  Lymphadenopathy:     Cervical: No cervical adenopathy.  Skin:    General: Skin is warm and dry.  Neurological:     Mental Status: He is alert and oriented to person, place, and time.       Assessment & Plan:   Gregory Evans was seen today for annual exam.  Diagnoses and all orders for this visit:  Primary hypertension -     CBC with Differential/Platelet -     CMP14+EGFR -     Urinalysis  Mixed hyperlipidemia -     Lipid panel  Vitamin D deficiency -     VITAMIN D 25 Hydroxy (Vit-D Deficiency, Fractures)  Elevated PSA -     PSA, total and free  Essential hypertension -     amLODipine (NORVASC) 10 MG tablet; TAKE 1 TABLET BY MOUTH EVERY DAY -     metoprolol tartrate (LOPRESSOR) 25 MG tablet; Take 1 tablet (25 mg total) by mouth 2 (two) times daily.  NSAID long-term use -     omeprazole (PRILOSEC) 40 MG capsule; Take 1 capsule (40 mg total) by mouth daily before breakfast.  Osteoarthritis, unspecified osteoarthritis type, unspecified site -     betamethasone acetate-betamethasone sodium phosphate (CELESTONE) injection 6 mg  Other orders -     chlorthalidone (HYGROTON) 25 MG tablet; Take 1 tablet (25 mg total) by mouth daily. -     levocetirizine (XYZAL) 5 MG tablet; Take 1 tablet (5 mg total) by mouth every evening. -     celecoxib (CELEBREX) 100 MG capsule; Take 1 capsule (100 mg total) by mouth daily. For arthritis pain. Take with food       I have discontinued Gregory Evans's pregabalin and ciprofloxacin. I am also having him start on celecoxib. Additionally, I am having him maintain his aspirin EC, acetaminophen, Muscle Rub, cholecalciferol, sildenafil, amLODipine, chlorthalidone, levocetirizine, metoprolol tartrate, and omeprazole. We will continue to administer betamethasone  acetate-betamethasone sodium phosphate.  Allergies as of 09/22/2023       Reactions   Ace Inhibitors    angioedema   Cozaar [losartan Potassium]    Swollen uvula        Medication List        Accurate as of September 22, 2023  4:21 PM. If you have any questions, ask your nurse or doctor.          STOP taking these medications    ciprofloxacin 500 MG tablet Commonly known as: Cipro Stopped by: Gregory Evans   pregabalin  50 MG capsule Commonly known as: Lyrica Stopped by: Gregory Evans       TAKE these medications    acetaminophen 325 MG tablet Commonly known as: TYLENOL Take 650 mg by mouth every 6 (six) hours as needed for moderate pain or headache.   amLODipine 10 MG tablet Commonly known as: NORVASC TAKE 1 TABLET BY MOUTH EVERY DAY   aspirin EC 81 MG tablet Take 81 mg by mouth daily.   celecoxib 100 MG capsule Commonly known as: CeleBREX Take 1 capsule (100 mg total) by mouth daily. For arthritis pain. Take with food Started by: Messina Kosinski   chlorthalidone 25 MG tablet Commonly known as: HYGROTON Take 1 tablet (25 mg total) by mouth daily.   cholecalciferol 25 MCG (1000 UNIT) tablet Commonly known as: VITAMIN D3 Take 1,000 Units by mouth daily.   levocetirizine 5 MG tablet Commonly known as: XYZAL Take 1 tablet (5 mg total) by mouth every evening.   metoprolol tartrate 25 MG tablet Commonly known as: LOPRESSOR Take 1 tablet (25 mg total) by mouth 2 (two) times daily. What changed: Another medication with the same name was removed. Continue taking this medication, and follow the directions you see here. Changed by: Eliyah Mcshea   Muscle Rub 10-15 % Crea Apply 1 application topically as needed for muscle pain.   omeprazole 40 MG capsule Commonly known as: PRILOSEC Take 1 capsule (40 mg total) by mouth daily before breakfast.   sildenafil 20 MG tablet Commonly known as: REVATIO TAKE 1-5 TABLETS BY MOUTH EVERY DAY AS NEEDED          Follow-up: Return in about 6 months (around 03/21/2024).  Mechele Claude, M.D.

## 2023-09-22 NOTE — Patient Instructions (Signed)

## 2023-09-23 LAB — URINALYSIS
Bilirubin, UA: NEGATIVE
Glucose, UA: NEGATIVE
Ketones, UA: NEGATIVE
Leukocytes,UA: NEGATIVE
Nitrite, UA: NEGATIVE
RBC, UA: NEGATIVE
Specific Gravity, UA: 1.02 (ref 1.005–1.030)
Urobilinogen, Ur: 1 mg/dL (ref 0.2–1.0)
pH, UA: 5.5 (ref 5.0–7.5)

## 2023-09-23 LAB — CBC WITH DIFFERENTIAL/PLATELET
Basophils Absolute: 0.1 10*3/uL (ref 0.0–0.2)
Basos: 1 %
EOS (ABSOLUTE): 0.4 10*3/uL (ref 0.0–0.4)
Eos: 6 %
Hematocrit: 43.1 % (ref 37.5–51.0)
Hemoglobin: 13.5 g/dL (ref 13.0–17.7)
Immature Grans (Abs): 0 10*3/uL (ref 0.0–0.1)
Immature Granulocytes: 0 %
Lymphocytes Absolute: 1.9 10*3/uL (ref 0.7–3.1)
Lymphs: 30 %
MCH: 28.9 pg (ref 26.6–33.0)
MCHC: 31.3 g/dL — ABNORMAL LOW (ref 31.5–35.7)
MCV: 92 fL (ref 79–97)
Monocytes Absolute: 0.8 10*3/uL (ref 0.1–0.9)
Monocytes: 13 %
Neutrophils Absolute: 3.2 10*3/uL (ref 1.4–7.0)
Neutrophils: 50 %
Platelets: 216 10*3/uL (ref 150–450)
RBC: 4.67 x10E6/uL (ref 4.14–5.80)
RDW: 11.7 % (ref 11.6–15.4)
WBC: 6.4 10*3/uL (ref 3.4–10.8)

## 2023-09-23 LAB — CMP14+EGFR
ALT: 38 [IU]/L (ref 0–44)
AST: 33 [IU]/L (ref 0–40)
Albumin: 4.4 g/dL (ref 3.9–4.9)
Alkaline Phosphatase: 44 [IU]/L (ref 44–121)
BUN/Creatinine Ratio: 19 (ref 10–24)
BUN: 23 mg/dL (ref 8–27)
Bilirubin Total: 0.8 mg/dL (ref 0.0–1.2)
CO2: 24 mmol/L (ref 20–29)
Calcium: 10.1 mg/dL (ref 8.6–10.2)
Chloride: 96 mmol/L (ref 96–106)
Creatinine, Ser: 1.19 mg/dL (ref 0.76–1.27)
Globulin, Total: 3.2 g/dL (ref 1.5–4.5)
Glucose: 105 mg/dL — ABNORMAL HIGH (ref 70–99)
Potassium: 3.4 mmol/L — ABNORMAL LOW (ref 3.5–5.2)
Sodium: 140 mmol/L (ref 134–144)
Total Protein: 7.6 g/dL (ref 6.0–8.5)
eGFR: 66 mL/min/{1.73_m2} (ref >59)

## 2023-09-23 LAB — LIPID PANEL
Chol/HDL Ratio: 4.1 ratio (ref 0.0–5.0)
Cholesterol, Total: 202 mg/dL — ABNORMAL HIGH (ref 100–199)
HDL: 49 mg/dL (ref >39)
LDL Chol Calc (NIH): 127 mg/dL — ABNORMAL HIGH (ref 0–99)
Triglycerides: 145 mg/dL (ref 0–149)
VLDL Cholesterol Cal: 26 mg/dL (ref 5–40)

## 2023-09-23 LAB — PSA, TOTAL AND FREE
PSA, Free Pct: 35.8 %
PSA, Free: 2.61 ng/mL
Prostate Specific Ag, Serum: 7.3 ng/mL — ABNORMAL HIGH (ref 0.0–4.0)

## 2023-09-23 LAB — VITAMIN D 25 HYDROXY (VIT D DEFICIENCY, FRACTURES): Vit D, 25-Hydroxy: 53.4 ng/mL (ref 30.0–100.0)

## 2023-09-28 ENCOUNTER — Other Ambulatory Visit: Payer: Self-pay | Admitting: Family Medicine

## 2023-09-28 DIAGNOSIS — R972 Elevated prostate specific antigen [PSA]: Secondary | ICD-10-CM

## 2023-12-03 ENCOUNTER — Ambulatory Visit: Payer: Managed Care, Other (non HMO) | Admitting: Urology

## 2023-12-13 ENCOUNTER — Other Ambulatory Visit: Payer: Self-pay | Admitting: Family Medicine

## 2024-01-21 ENCOUNTER — Ambulatory Visit: Payer: Managed Care, Other (non HMO) | Admitting: Urology

## 2024-01-21 VITALS — BP 186/70 | HR 67

## 2024-01-21 DIAGNOSIS — R351 Nocturia: Secondary | ICD-10-CM

## 2024-01-21 DIAGNOSIS — R972 Elevated prostate specific antigen [PSA]: Secondary | ICD-10-CM | POA: Diagnosis not present

## 2024-01-21 DIAGNOSIS — N528 Other male erectile dysfunction: Secondary | ICD-10-CM

## 2024-01-21 DIAGNOSIS — N138 Other obstructive and reflux uropathy: Secondary | ICD-10-CM

## 2024-01-21 DIAGNOSIS — N401 Enlarged prostate with lower urinary tract symptoms: Secondary | ICD-10-CM | POA: Diagnosis not present

## 2024-01-21 NOTE — Progress Notes (Signed)
 Subjective: 1. Elevated PSA   2. BPH with urinary obstruction   3. Nocturia   4. Other male erectile dysfunction      Consult requested by Mechele Claude MD  Gregory Evans is a 68 yo male who I saw in [place] in 2010.  He is sent back by Dr. Darlyn Read for an elevated PSA that 7.3 with a 36% f/t ratio on 09/22/23.  It was 5.6 in 10/23 and 7/22, 5.7 in 9/21, 4.2 in 9/20, 5.2 in 3/20, 4.8 in 9/19, 3.4 in 5/18, 2.4 in 11/16 and 2.1 in 6/15.   His IPSS is 5 with nocturia x 2.  He has sildenafil but hasn't been active since losing his wife 3 years ago.  He has a CT in 2022 that reported a markedly enlarged prostate.   He has had no hematuria.  He has weight has been variable.  He has no bone pain but has some shoulder arthritis.  ROS:  Review of Systems  HENT:  Positive for congestion.   Respiratory:  Positive for shortness of breath.   Musculoskeletal:  Positive for back pain and joint pain.  Psychiatric/Behavioral:  Positive for depression. The patient is nervous/anxious.   All other systems reviewed and are negative.   Allergies  Allergen Reactions   Ace Inhibitors     angioedema   Cozaar [Losartan Potassium]     Swollen uvula    Past Medical History:  Diagnosis Date   Allergy    Colon polyp 2008   Erectile dysfunction    Hyperlipidemia    Hypertension    Osteoarthritis    Vitamin D deficiency     Past Surgical History:  Procedure Laterality Date   COLONOSCOPY WITH PROPOFOL N/A 12/26/2019   Procedure: COLONOSCOPY WITH PROPOFOL;  Surgeon: Corbin Ade, MD;  Location: [facility];  Service: Endoscopy;  Laterality: N/A;  12:15pm   CYSTECTOMY     patietn reports removal of chest wall cyst as a teenager    HERNIA REPAIR      Social History   Socioeconomic History   Marital status: Married    Spouse name: Not on file   Number of children: Not on file   Years of education: Not on file   Highest education level: Not on file  Occupational History    Employer: [employer] [place]  Tobacco Use   Smoking status: Former    Current packs/day: 0.00    Types: Cigarettes    Quit date: 08/19/1993    Years since quitting: 30.4   Smokeless tobacco: Never  Substance and Sexual Activity   Alcohol use: Yes    Comment: 4 shots daily   Drug use: No   Sexual activity: Not on file  Other Topics Concern   Not on file  Social History Narrative   Married    6 children   Outside pet   Social Drivers of Health   Financial Resource Strain: Low Risk  (08/19/2021)   Received from [facility], [facility]   Overall Financial Resource Strain (CARDIA)    Difficulty of Paying Living Expenses: Not hard at all  Food Insecurity: No Food Insecurity (08/19/2021)   Received from [facility], [facility]   Hunger Vital Sign    Worried About Running Out of Food in the Last Year: Never true    Ran Out of Food in the Last Year: Never true  Transportation Needs: No Transportation Needs (08/19/2021)   Received from  [facility], [facility]   PRAPARE - Transportation    Lack of Transportation (Medical): No    Lack of Transportation (Non-Medical): No  Physical Activity: Not on file  Stress: Not on file  Social Connections: Not on file  Intimate Partner Violence: Not on file    Family History  Problem Relation Age of Onset   Hypertension Mother    Dementia Mother    Diabetes Father 12   Colon cancer Neg Hx     Anti-infectives: Anti-infectives (From admission, onward)    None       Current Outpatient Medications  Medication Sig Dispense Refill   acetaminophen (TYLENOL) 325 MG tablet Take 650 mg by mouth every 6 (six) hours as needed for moderate pain or headache.     amLODipine (NORVASC) 10 MG tablet TAKE 1 TABLET BY MOUTH EVERY DAY 90 tablet 3   aspirin EC 81 MG tablet Take 81 mg by mouth daily.     celecoxib (CELEBREX) 100 MG capsule Take 1 capsule (100 mg total) by mouth daily. For arthritis pain. Take with food 90 capsule 1    chlorthalidone (HYGROTON) 25 MG tablet Take 1 tablet (25 mg total) by mouth daily. 90 tablet 3   cholecalciferol (VITAMIN D3) 25 MCG (1000 UNIT) tablet Take 1,000 Units by mouth daily.     levocetirizine (XYZAL) 5 MG tablet Take 1 tablet (5 mg total) by mouth every evening. 90 tablet 3   Menthol-Methyl Salicylate (MUSCLE RUB) 10-15 % CREA Apply 1 application topically as needed for muscle pain.     metoprolol tartrate (LOPRESSOR) 25 MG tablet Take 1 tablet (25 mg total) by mouth 2 (two) times daily. 180 tablet 3   omeprazole (PRILOSEC) 40 MG capsule Take 1 capsule (40 mg total) by mouth daily before breakfast. 90 capsule 3   sildenafil (REVATIO) 20 MG tablet TAKE 1-5 TABLETS BY MOUTH EVERY DAY AS NEEDED 100 tablet 1   Current Facility-Administered Medications  Medication Dose Route Frequency Provider Last Rate Last Admin   betamethasone acetate-betamethasone sodium phosphate (CELESTONE) injection 6 mg  6 mg Intramuscular Once          Objective: Vital signs in last 24 hours: BP (!) 186/70   Pulse 67   Intake/Output from previous day: No intake/output data recorded. Intake/Output this shift: @IOTHISSHIFT @   Physical Exam Vitals reviewed.  Constitutional:      Appearance: Normal appearance. He is obese.  Cardiovascular:     Rate and Rhythm: Normal rate and regular rhythm.     Heart sounds: Murmur heard.  Pulmonary:     Effort: Pulmonary effort is normal. No respiratory distress.     Breath sounds: Normal breath sounds.  Abdominal:     Palpations: Abdomen is soft.     Hernia: No hernia is present.  Genitourinary:    Comments: Uncirc phallus with an adequate meatus. Scrotum, testes and epididymis normal. AP/NST without mass or lesions. Prostate is 2.5+ benign SV non-palpable.  Musculoskeletal:        General: No swelling. Normal range of motion.  Skin:    General: Skin is warm and dry.  Neurological:     General: No focal deficit present.     Mental Status: He is alert  and oriented to person, place, and time.  Psychiatric:        Mood and Affect: Mood normal.        Behavior: Behavior normal.     Lab Results:  Results for orders  placed or performed in visit on 01/21/24 (from the past 24 hours)  Urinalysis, Routine w reflex microscopic     Status: Abnormal   Collection Time: 01/21/24  2:57 PM  Result Value Ref Range   Specific Gravity, UA 1.015 1.005 - 1.030   pH, UA 7.0 5.0 - 7.5   Color, UA Yellow Yellow   Appearance Ur Clear Clear   Leukocytes,UA Negative Negative   Protein,UA Negative Negative/Trace   Glucose, UA Negative Negative   Ketones, UA Negative Negative   RBC, UA Negative Negative   Bilirubin, UA Negative Negative   Urobilinogen, Ur 4.0 (H) 0.2 - 1.0 mg/dL   Nitrite, UA Negative Negative   Microscopic Examination Comment    Narrative   Performed at:  [address] [facility] [address] [place], [place]  [id number] Lab Director: Chinita Pester MT, Phone:  [phone]    BMET No results for input(s): "NA", "K", "CL", "CO2", "GLUCOSE", "BUN", "CREATININE", "CALCIUM" in the last 72 hours. PT/INR No results for input(s): "LABPROT", "INR" in the last 72 hours. ABG No results for input(s): "PHART", "HCO3" in the last 72 hours.  Invalid input(s): "PCO2", "PO2" UA is clear.  Lab Results  Component Value Date   PSA1 7.3 (H) 09/22/2023   PSA1 5.6 (H) 08/20/2022   PSA1 5.6 (H) 05/29/2021   PSA1 5.7 (H) 08/15/2020   PSA1 4.2 (H) 08/11/2019    Studies/Results: No results found. Report from CT in 2022 reviewed.   Assessment/Plan: Elevated PSA.  His PSA has been rising gradually over the last few years but his f/t ratio is favorable and he has a large prostate.   I will get an MRIP and if positive set him up for a biopsy.  I have reviewed the risks.  If negative, I will repeat a PSA in 3 months before deciding on a biopsy.  BPH with BOO.  He has mild LUTS>  ED.  He has a current script for sildenafil but has not been  active.   No orders of the defined types were placed in this encounter.    Orders Placed This Encounter  Procedures   MR PROSTATE W WO CONTRAST    Standing Status:   Future    Expected Date:   01/22/2024    Expiration Date:   07/23/2024    If indicated for the ordered procedure, I authorize the administration of contrast media per Radiology protocol:   Yes    What is the patient's sedation requirement?:   No Sedation    Does the patient have a pacemaker or implanted devices?:   No    Radiology Contrast Protocol - do NOT remove file path:   [email or web address]    Preferred imaging location?:   [facility] (table limit-550lbs)   Urinalysis, Routine w reflex microscopic   PSA, total and free    Standing Status:   Future    Expected Date:   04/22/2024    Expiration Date:   07/23/2024     Return in about 3 months (around 04/22/2024) for with MRI results and PSA. Marland Kitchen    CC: Dr. Mechele Claude.      Gregory Evans 01/22/2024

## 2024-01-22 ENCOUNTER — Encounter: Payer: Self-pay | Admitting: Urology

## 2024-01-22 LAB — URINALYSIS, ROUTINE W REFLEX MICROSCOPIC
Bilirubin, UA: NEGATIVE
Glucose, UA: NEGATIVE
Ketones, UA: NEGATIVE
Leukocytes,UA: NEGATIVE
Nitrite, UA: NEGATIVE
Protein,UA: NEGATIVE
RBC, UA: NEGATIVE
Specific Gravity, UA: 1.015 (ref 1.005–1.030)
Urobilinogen, Ur: 4 mg/dL — ABNORMAL HIGH (ref 0.2–1.0)
pH, UA: 7 (ref 5.0–7.5)

## 2024-01-29 ENCOUNTER — Ambulatory Visit (HOSPITAL_COMMUNITY)

## 2024-03-07 ENCOUNTER — Encounter: Payer: Self-pay | Admitting: Urology

## 2024-03-12 ENCOUNTER — Ambulatory Visit
Admission: RE | Admit: 2024-03-12 | Discharge: 2024-03-12 | Disposition: A | Discharge status: Home / Self Care | Source: Ambulatory Visit | Attending: Urology | Admitting: Urology

## 2024-03-12 DIAGNOSIS — R972 Elevated prostate specific antigen [PSA]: Secondary | ICD-10-CM | POA: Diagnosis not present

## 2024-03-12 MED ORDER — GADOPICLENOL 0.5 MMOL/ML IV SOLN
10.0000 mL | Freq: Once | INTRAVENOUS | Status: AC | PRN
  Administered 2024-03-12: 10 mL via INTRAVENOUS

## 2024-03-16 ENCOUNTER — Telehealth: Payer: Self-pay

## 2024-03-16 NOTE — Telephone Encounter (Signed)
-----   Message from Homero Luster sent at 03/16/2024 12:36 PM EDT ----- He has a very large prostate with no high risk lesions on MRI.  He doesn't need a biopsy at this time and just needs f/u as scheduled. ----- Message ----- From: Dyke Glasser, CMA Sent: 03/16/2024  11:01 AM EDT To: Homero Luster, MD  Please review.

## 2024-03-16 NOTE — Telephone Encounter (Signed)
 Called to relay MRI results per MD wrenn see previous telephone encounter pt voiced understanding

## 2024-03-20 ENCOUNTER — Other Ambulatory Visit: Payer: Self-pay | Admitting: Family Medicine

## 2024-03-21 ENCOUNTER — Ambulatory Visit: Payer: BC Managed Care – PPO | Admitting: Family Medicine

## 2024-03-21 ENCOUNTER — Encounter: Payer: Self-pay | Admitting: Family Medicine

## 2024-03-21 VITALS — BP 117/68 | HR 54 | Temp 97.8°F | Ht 71.5 in | Wt 281.0 lb

## 2024-03-21 DIAGNOSIS — I10 Essential (primary) hypertension: Secondary | ICD-10-CM

## 2024-03-21 DIAGNOSIS — N401 Enlarged prostate with lower urinary tract symptoms: Secondary | ICD-10-CM | POA: Diagnosis not present

## 2024-03-21 DIAGNOSIS — M1990 Unspecified osteoarthritis, unspecified site: Secondary | ICD-10-CM

## 2024-03-21 DIAGNOSIS — Z125 Encounter for screening for malignant neoplasm of prostate: Secondary | ICD-10-CM | POA: Diagnosis not present

## 2024-03-21 DIAGNOSIS — E782 Mixed hyperlipidemia: Secondary | ICD-10-CM

## 2024-03-21 DIAGNOSIS — N528 Other male erectile dysfunction: Secondary | ICD-10-CM | POA: Diagnosis not present

## 2024-03-21 MED ORDER — CELECOXIB 100 MG PO CAPS: 100.0000 mg | ORAL_CAPSULE | Freq: Every day | ORAL | 1 refills | Status: DC

## 2024-03-21 MED ORDER — BETAMETHASONE SOD PHOS & ACET 6 (3-3) MG/ML IJ SUSP: 6.0000 mg | Freq: Once | INTRAMUSCULAR | Status: DC

## 2024-03-21 MED ORDER — PREGABALIN 50 MG PO CAPS: ORAL_CAPSULE | ORAL | 0 refills | Status: DC

## 2024-03-21 NOTE — Progress Notes (Signed)
 Subjective:  Patient ID: Gregory Evans, male    DOB: Nov 24, 1955  Age: 68 y.o. MRN: [id number]  CC: Follow-up, Shoulder Pain (Left shoulder pain for awhile. Had to get injection last time it flared up. ), and Burning (Off and on burning in legs. Pt states that he mentioned it before. Happens after standing for awhile. )   HPI Keri Hoelzle presents for  follow-up of hypertension. Patient has no history of headache chest pain or shortness of breath or recent cough. Patient also denies symptoms of TIA such as focal numbness or weakness. Patient denies side effects from medication. States taking it regularly.  Patient in for follow-up of GERD. Currently asymptomatic taking  PPI daily. There is no chest pain or heartburn. No hematemesis and no melena. No dysphagia or choking. Onset is remote. Progression is stable. Complicating factors, none.  Recent MRI prostate showed BPH, no cancer!  History Deryk has a past medical history of Allergy, Colon polyp (2008), Erectile dysfunction, Hyperlipidemia, Hypertension, Osteoarthritis, and Vitamin D  deficiency.   He has a past surgical history that includes Cystectomy; Colonoscopy with propofol  (N/A, 12/26/2019); and Hernia repair.   His family history includes Dementia in his mother; Diabetes (age of onset: 57) in his father; Hypertension in his mother.He reports that he quit smoking about 30 years ago. His smoking use included cigarettes. He has never used smokeless tobacco. He reports current alcohol use. He reports that he does not use drugs.  Current Outpatient Medications on File Prior to Visit  Medication Sig Dispense Refill   acetaminophen  (TYLENOL ) 325 MG tablet Take 650 mg by mouth every 6 (six) hours as needed for moderate pain or headache.     amLODipine  (NORVASC ) 10 MG tablet TAKE 1 TABLET BY MOUTH EVERY DAY 90 tablet 3   aspirin EC 81 MG tablet Take 81 mg by mouth daily.     chlorthalidone  (HYGROTON ) 25 MG tablet Take 1 tablet (25 mg total) by  mouth daily. 90 tablet 3   cholecalciferol (VITAMIN D3) 25 MCG (1000 UNIT) tablet Take 1,000 Units by mouth daily.     levocetirizine (XYZAL ) 5 MG tablet Take 1 tablet (5 mg total) by mouth every evening. 90 tablet 3   Menthol-Methyl Salicylate (MUSCLE RUB) 10-15 % CREA Apply 1 application topically as needed for muscle pain.     metoprolol  tartrate (LOPRESSOR ) 25 MG tablet Take 1 tablet (25 mg total) by mouth 2 (two) times daily. 180 tablet 3   omeprazole  (PRILOSEC) 40 MG capsule Take 1 capsule (40 mg total) by mouth daily before breakfast. 90 capsule 3   sildenafil  (REVATIO ) 20 MG tablet TAKE 1-5 TABLETS BY MOUTH EVERY DAY AS NEEDED 100 tablet 1   [DISCONTINUED] nebivolol  (BYSTOLIC ) 10 MG tablet Take 2 tablets (20 mg total) by mouth daily. 60 tablet 0   No current facility-administered medications on file prior to visit.    ROS Review of Systems  Constitutional: Negative.   HENT: Negative.    Eyes:  Negative for visual disturbance.  Respiratory:  Negative for cough and shortness of breath.   Cardiovascular:  Negative for chest pain and leg swelling.  Gastrointestinal:  Negative for abdominal pain, diarrhea, nausea and vomiting.  Genitourinary:  Negative for difficulty urinating.  Musculoskeletal:  Positive for arthralgias (multiple joints). Negative for myalgias.  Skin:  Negative for rash.  Neurological:  Negative for headaches.  Psychiatric/Behavioral:  Negative for sleep disturbance.     Objective:  BP 117/68   Pulse (!) 54  Temp 97.8 F (36.6 C)   Ht 5' 11.5" (1.816 m)   Wt 281 lb (127.5 kg)   SpO2 96%   BMI 38.65 kg/m   BP Readings from Last 3 Encounters:  03/21/24 117/68  01/21/24 (!) 186/70  09/22/23 132/73    Wt Readings from Last 3 Encounters:  03/21/24 281 lb (127.5 kg)  09/22/23 278 lb 12.8 oz (126.5 kg)  02/19/23 265 lb 6.4 oz (120.4 kg)     Physical Exam Vitals reviewed.  Constitutional:      Appearance: He is well-developed.  HENT:     Head:  Normocephalic and atraumatic.     Right Ear: External ear normal.     Left Ear: External ear normal.     Mouth/Throat:     Pharynx: No oropharyngeal exudate or posterior oropharyngeal erythema.  Eyes:     Pupils: Pupils are equal, round, and reactive to light.  Cardiovascular:     Rate and Rhythm: Normal rate and regular rhythm.     Heart sounds: No murmur heard. Pulmonary:     Effort: No respiratory distress.     Breath sounds: Normal breath sounds.  Musculoskeletal:     Cervical back: Normal range of motion and neck supple.  Neurological:     Mental Status: He is alert and oriented to person, place, and time.       Assessment & Plan:  Primary hypertension  Mixed hyperlipidemia  Other male erectile dysfunction  Osteoarthritis, unspecified osteoarthritis type, unspecified site -     Betamethasone  Sod Phos & Acet  Benign prostatic hyperplasia with lower urinary tract symptoms, symptom details unspecified -     PSA, total and free; Future  Other orders -     Celecoxib ; Take 1 capsule (100 mg total) by mouth daily. For arthritis pain. Take with food  Dispense: 90 capsule; Refill: 1 -     Pregabalin ; 1 qhs X7 days , then 2 qhs X 7d, then 3 qhs X 7d, then 4 qhs  Dispense: 120 capsule; Refill: 0    Allergies as of 03/21/2024       Reactions   Ace Inhibitors    angioedema   Cozaar [losartan Potassium]    Swollen uvula        Medication List        Accurate as of Mar 21, 2024 11:59 PM. If you have any questions, ask your nurse or doctor.          acetaminophen  325 MG tablet Commonly known as: TYLENOL  Take 650 mg by mouth every 6 (six) hours as needed for moderate pain or headache.   amLODipine  10 MG tablet Commonly known as: NORVASC  TAKE 1 TABLET BY MOUTH EVERY DAY   aspirin EC 81 MG tablet Take 81 mg by mouth daily.   celecoxib  100 MG capsule Commonly known as: CELEBREX  Take 1 capsule (100 mg total) by mouth daily. For arthritis pain. Take with food    chlorthalidone  25 MG tablet Commonly known as: HYGROTON  Take 1 tablet (25 mg total) by mouth daily.   cholecalciferol 25 MCG (1000 UNIT) tablet Commonly known as: VITAMIN D3 Take 1,000 Units by mouth daily.   levocetirizine 5 MG tablet Commonly known as: XYZAL  Take 1 tablet (5 mg total) by mouth every evening.   metoprolol  tartrate 25 MG tablet Commonly known as: LOPRESSOR  Take 1 tablet (25 mg total) by mouth 2 (two) times daily.   Muscle Rub 10-15 % Crea Apply 1 application topically as needed for  muscle pain.   omeprazole  40 MG capsule Commonly known as: PRILOSEC Take 1 capsule (40 mg total) by mouth daily before breakfast.   pregabalin  50 MG capsule Commonly known as: Lyrica  1 qhs X7 days , then 2 qhs X 7d, then 3 qhs X 7d, then 4 qhs Started by: Avish Torry   sildenafil  20 MG tablet Commonly known as: REVATIO  TAKE 1-5 TABLETS BY MOUTH EVERY DAY AS NEEDED         Follow-up: Return in about 6 months (around 09/21/2024) for [facility] physical.  Roise Cleaver, M.D.

## 2024-03-22 LAB — PSA, TOTAL AND FREE
PSA, Free Pct: 37.8 %
PSA, Free: 2.46 ng/mL
Prostate Specific Ag, Serum: 6.5 ng/mL — ABNORMAL HIGH (ref 0.0–4.0)

## 2024-04-28 ENCOUNTER — Other Ambulatory Visit

## 2024-04-28 DIAGNOSIS — R972 Elevated prostate specific antigen [PSA]: Secondary | ICD-10-CM

## 2024-04-29 ENCOUNTER — Ambulatory Visit: Payer: Self-pay

## 2024-04-29 LAB — PSA, TOTAL AND FREE
PSA, Free Pct: 37.1 %
PSA, Free: 2.41 ng/mL
Prostate Specific Ag, Serum: 6.5 ng/mL — ABNORMAL HIGH (ref 0.0–4.0)

## 2024-05-05 ENCOUNTER — Encounter: Payer: Self-pay | Admitting: Urology

## 2024-05-05 ENCOUNTER — Ambulatory Visit: Admitting: Urology

## 2024-05-05 VITALS — BP 157/73 | HR 70

## 2024-05-05 DIAGNOSIS — N528 Other male erectile dysfunction: Secondary | ICD-10-CM

## 2024-05-05 DIAGNOSIS — R351 Nocturia: Secondary | ICD-10-CM | POA: Diagnosis not present

## 2024-05-05 DIAGNOSIS — N401 Enlarged prostate with lower urinary tract symptoms: Secondary | ICD-10-CM | POA: Diagnosis not present

## 2024-05-05 DIAGNOSIS — R972 Elevated prostate specific antigen [PSA]: Secondary | ICD-10-CM | POA: Diagnosis not present

## 2024-05-05 DIAGNOSIS — N138 Other obstructive and reflux uropathy: Secondary | ICD-10-CM

## 2024-05-05 NOTE — Progress Notes (Signed)
 Subjective: 1. Elevated PSA   2. BPH with urinary obstruction   3. Nocturia   4. Other male erectile dysfunction      05/05/24: Mr. Lowdermilk returns today in f/u.  His PSA was 6.5 with a 37.8% f/t ratio on 03/21/24 and 6.5 with a 37.1% f/t ratio on 04/28/24.  He had a prostate MRI on 03/12/24 and had prostate with no high risk lesions. HIs IPSS is 1 with nocturia x 1.    01/21/24: Mr. Vanderlinde is a 68 yo male who I saw in [place] in 2010.  He is sent back by Dr. Zollie for an elevated PSA that 7.3 with a 36% f/t ratio on 09/22/23.  It was 5.6 in 10/23 and 7/22, 5.7 in 9/21, 4.2 in 9/20, 5.2 in 3/20, 4.8 in 9/19, 3.4 in 5/18, 2.4 in 11/16 and 2.1 in 6/15.   His IPSS is 5 with nocturia x 2.  He has sildenafil  but hasn't been active since losing his wife 3 years ago.  He has a CT in 2022 that reported a markedly enlarged prostate.   He has had no hematuria.  He has weight has been variable.  He has no bone pain but has some shoulder arthritis.  ROS:  Review of Systems  All other systems reviewed and are negative.   Allergies  Allergen Reactions   Ace Inhibitors     angioedema   Cozaar [Losartan Potassium]     Swollen uvula    Past Medical History:  Diagnosis Date   Allergy    Colon polyp 2008   Erectile dysfunction    Hyperlipidemia    Hypertension    Osteoarthritis    Vitamin D  deficiency     Past Surgical History:  Procedure Laterality Date   COLONOSCOPY WITH PROPOFOL  N/A 12/26/2019   Procedure: COLONOSCOPY WITH PROPOFOL ;  Surgeon: Shaaron Lamar HERO, MD;  Location: [facility];  Service: Endoscopy;  Laterality: N/A;  12:15pm   CYSTECTOMY     patietn reports removal of chest wall cyst as a teenager    HERNIA REPAIR      Social History   Socioeconomic History   Marital status: Married    Spouse name: Not on file   Number of children: Not on file   Years of education: Not on file   Highest education level: Not on file  Occupational History    Employer: [employer]   Tobacco Use   Smoking status: Former    Current packs/day: 0.00    Types: Cigarettes    Quit date: 08/19/1993    Years since quitting: 30.7   Smokeless tobacco: Never  Substance and Sexual Activity   Alcohol use: Yes    Comment: 4 shots daily   Drug use: No   Sexual activity: Not on file  Other Topics Concern   Not on file  Social History Narrative   Married    6 children   Outside pet   Social Drivers of Health   Financial Resource Strain: Low Risk  (08/19/2021)   Received from [facility]   Overall Financial Resource Strain (CARDIA)    Difficulty of Paying Living Expenses: Not hard at all  Food Insecurity: No Food Insecurity (08/19/2021)   Received from [facility]   Hunger Vital Sign    Within the past 12 months, you worried that your food would run out before you got the money to buy more.: Never true    Within the past  12 months, the food you bought just didn't last and you didn't have money to get more.: Never true  Transportation Needs: No Transportation Needs (08/19/2021)   Received from [facility] - Transportation    Lack of Transportation (Medical): No    Lack of Transportation (Non-Medical): No  Physical Activity: Not on file  Stress: Not on file  Social Connections: Not on file  Intimate Partner Violence: Not on file    Family History  Problem Relation Age of Onset   Hypertension Mother    Dementia Mother    Diabetes Father 26   Colon cancer Neg Hx     Anti-infectives: Anti-infectives (From admission, onward)    None       Current Outpatient Medications  Medication Sig Dispense Refill   acetaminophen  (TYLENOL ) 325 MG tablet Take 650 mg by mouth every 6 (six) hours as needed for moderate pain or headache.     amLODipine  (NORVASC ) 10 MG tablet TAKE 1 TABLET BY MOUTH EVERY DAY 90 tablet 3   aspirin EC 81 MG tablet Take 81 mg by mouth daily.     celecoxib  (CELEBREX ) 100 MG capsule Take 1 capsule (100 mg  total) by mouth daily. For arthritis pain. Take with food 90 capsule 1   chlorthalidone  (HYGROTON ) 25 MG tablet Take 1 tablet (25 mg total) by mouth daily. 90 tablet 3   cholecalciferol (VITAMIN D3) 25 MCG (1000 UNIT) tablet Take 1,000 Units by mouth daily.     levocetirizine (XYZAL ) 5 MG tablet Take 1 tablet (5 mg total) by mouth every evening. 90 tablet 3   Menthol-Methyl Salicylate (MUSCLE RUB) 10-15 % CREA Apply 1 application topically as needed for muscle pain.     metoprolol  tartrate (LOPRESSOR ) 25 MG tablet Take 1 tablet (25 mg total) by mouth 2 (two) times daily. 180 tablet 3   omeprazole  (PRILOSEC) 40 MG capsule Take 1 capsule (40 mg total) by mouth daily before breakfast. 90 capsule 3   pregabalin  (LYRICA ) 50 MG capsule 1 qhs X7 days , then 2 qhs X 7d, then 3 qhs X 7d, then 4 qhs 120 capsule 0   sildenafil  (REVATIO ) 20 MG tablet TAKE 1-5 TABLETS BY MOUTH EVERY DAY AS NEEDED 100 tablet 1   Current Facility-Administered Medications  Medication Dose Route Frequency Provider Last Rate Last Admin   betamethasone  acetate-betamethasone  sodium phosphate (CELESTONE ) injection 6 mg  6 mg Intramuscular Once          Objective: Vital signs in last 24 hours: BP (!) 157/73   Pulse 70   Intake/Output from previous day: No intake/output data recorded. Intake/Output this shift: @IOTHISSHIFT @   Physical Exam  Lab Results:  Results for orders placed or performed in visit on 05/05/24 (from the past 24 hours)  Urinalysis, Routine w reflex microscopic     Status: None   Collection Time: 05/05/24  3:48 PM  Result Value Ref Range   Specific Gravity, UA 1.015 1.005 - 1.030   pH, UA 7.0 5.0 - 7.5   Color, UA Yellow Yellow   Appearance Ur Clear Clear   Leukocytes,UA Negative Negative   Protein,UA Trace Negative/Trace   Glucose, UA Negative Negative   Ketones, UA Negative Negative   RBC, UA Negative Negative   Bilirubin, UA Negative Negative   Urobilinogen, Ur 1.0 0.2 - 1.0 mg/dL    Nitrite, UA Negative Negative   Microscopic Examination Comment    Narrative   Performed at:  01 - [facility]  [address], [place], [place]  [id number] Lab Director: Cherlyn Ore MT, Phone:  [phone]     BMET No results for input(s): NA, K, CL, CO2, GLUCOSE, BUN, CREATININE, CALCIUM in the last 72 hours. PT/INR No results for input(s): LABPROT, INR in the last 72 hours. ABG No results for input(s): PHART, HCO3 in the last 72 hours.  Invalid input(s): PCO2, PO2 UA is clear.  Lab Results  Component Value Date   PSA1 6.5 (H) 04/28/2024   PSA1 6.5 (H) 03/21/2024   PSA1 7.3 (H) 09/22/2023   PSA1 5.6 (H) 08/20/2022   PSA1 5.6 (H) 05/29/2021    Studies/Results: No results found. MR PROSTATE W WO CONTRAST Result Date: 03/16/2024 CLINICAL DATA:  Elevated PSA. EXAM: MR PROSTATE WITHOUT AND WITH CONTRAST TECHNIQUE: Multiplanar multisequence MRI images were obtained of the pelvis centered about the prostate. Pre and post contrast images were obtained. CONTRAST:  10 mL UA COMPARISON:  None Available. FINDINGS: Prostate: -- Peripheral Zone: Diffuse thinning limits evaluation, however, no focal lesion seen on ADC or high b-value DWI sequences. -- Transition/Central Zone: Markedly and asymmetric enlargement, with dominant large cystic BPH nodule in the left transition zone measuring 6.3 cm in diameter. Multiple less than 1 cm BPH nodules are seen in the right transition zone. No non-circumscribed or otherwise suspicious appearing nodules identified. -- Measurements:  7.6 by 7.3 by 7.4 cm Transcapsular spread:  Absent Seminal vesicle involvement:  Absent Neurovascular bundle involvement:  Absent Pelvic adenopathy: None visualized Bone metastasis: None visualized Other:  None IMPRESSION: No radiographic evidence of high-grade prostate carcinoma. PI-RADS 2 (v.2.1): Low (clinically significant cancer unlikely) Electronically Signed   By: Norleen DELENA Kil M.D.   On:  03/16/2024 10:55     Assessment/Plan: Elevated PSA.  His PSA has been rising gradually over the last few years but is back down from the high and his f/t ratio is favorable and he has a very large prostate with a PSAD of 0.0336.  He had a large left BPH nodule on the MRI. SABRA    BPH with BOO.  He has mild LUTS on no treatment.  ED.  He has not been active.   No orders of the defined types were placed in this encounter.    Orders Placed This Encounter  Procedures   Urinalysis, Routine w reflex microscopic   PSA, total and free    Standing Status:   Future    Expected Date:   11/04/2024    Expiration Date:   05/05/2025   Ambulatory referral to Urology    Referral Priority:   Routine    Referral Type:   Consultation    Referral Reason:   Patient Preference    Referred to Provider:   Watt Norleen, MD    Requested Specialty:   Urology    Number of Visits Requested:   1     Return in about 6 months (around 11/04/2024) for in [place] with a PSA. SABRA    CC: Dr. Butler Der.      Reynalda Canny 05/06/2024

## 2024-05-06 LAB — URINALYSIS, ROUTINE W REFLEX MICROSCOPIC
Bilirubin, UA: NEGATIVE
Glucose, UA: NEGATIVE
Ketones, UA: NEGATIVE
Leukocytes,UA: NEGATIVE
Nitrite, UA: NEGATIVE
RBC, UA: NEGATIVE
Specific Gravity, UA: 1.015 (ref 1.005–1.030)
Urobilinogen, Ur: 1 mg/dL (ref 0.2–1.0)
pH, UA: 7 (ref 5.0–7.5)

## 2024-05-09 ENCOUNTER — Other Ambulatory Visit: Payer: Self-pay | Admitting: Family Medicine

## 2024-05-09 DIAGNOSIS — M1990 Unspecified osteoarthritis, unspecified site: Secondary | ICD-10-CM

## 2024-09-18 ENCOUNTER — Other Ambulatory Visit: Payer: Self-pay | Admitting: Family Medicine

## 2024-09-18 DIAGNOSIS — I10 Essential (primary) hypertension: Secondary | ICD-10-CM

## 2024-09-18 DIAGNOSIS — Z791 Long term (current) use of non-steroidal anti-inflammatories (NSAID): Secondary | ICD-10-CM

## 2024-09-29 ENCOUNTER — Encounter: Payer: Self-pay | Admitting: Family Medicine

## 2024-09-29 ENCOUNTER — Ambulatory Visit: Payer: Self-pay | Admitting: Family Medicine

## 2024-09-29 VITALS — BP 142/67 | HR 58 | Temp 98.0°F | Ht 71.5 in | Wt 268.0 lb

## 2024-09-29 DIAGNOSIS — R351 Nocturia: Secondary | ICD-10-CM | POA: Diagnosis not present

## 2024-09-29 DIAGNOSIS — E559 Vitamin D deficiency, unspecified: Secondary | ICD-10-CM | POA: Diagnosis not present

## 2024-09-29 DIAGNOSIS — I10 Essential (primary) hypertension: Secondary | ICD-10-CM

## 2024-09-29 DIAGNOSIS — Z0001 Encounter for general adult medical examination with abnormal findings: Secondary | ICD-10-CM

## 2024-09-29 DIAGNOSIS — N528 Other male erectile dysfunction: Secondary | ICD-10-CM

## 2024-09-29 DIAGNOSIS — E782 Mixed hyperlipidemia: Secondary | ICD-10-CM | POA: Diagnosis not present

## 2024-09-29 DIAGNOSIS — N401 Enlarged prostate with lower urinary tract symptoms: Secondary | ICD-10-CM | POA: Diagnosis not present

## 2024-09-29 DIAGNOSIS — Z23 Encounter for immunization: Secondary | ICD-10-CM | POA: Diagnosis not present

## 2024-09-29 DIAGNOSIS — M1990 Unspecified osteoarthritis, unspecified site: Secondary | ICD-10-CM

## 2024-09-29 LAB — URINALYSIS
Bilirubin, UA: NEGATIVE
Glucose, UA: NEGATIVE
Leukocytes,UA: NEGATIVE
Nitrite, UA: NEGATIVE
RBC, UA: NEGATIVE
Specific Gravity, UA: 1.02 (ref 1.005–1.030)
Urobilinogen, Ur: 0.2 mg/dL (ref 0.2–1.0)
pH, UA: 7 (ref 5.0–7.5)

## 2024-09-29 MED ORDER — PREGABALIN 100 MG PO CAPS: 100.0000 mg | ORAL_CAPSULE | Freq: Every day | ORAL | 1 refills | Status: AC

## 2024-09-29 MED ORDER — SILDENAFIL CITRATE 20 MG PO TABS: ORAL_TABLET | ORAL | 1 refills | Status: AC

## 2024-09-29 MED ORDER — CELECOXIB 100 MG PO CAPS: 100.0000 mg | ORAL_CAPSULE | Freq: Every day | ORAL | 1 refills | Status: DC

## 2024-09-29 MED ORDER — AMLODIPINE BESYLATE 10 MG PO TABS
ORAL_TABLET | ORAL | 3 refills | Status: AC
Start: 2024-09-29 — End: ?

## 2024-09-29 MED ORDER — BETAMETHASONE SOD PHOS & ACET 6 (3-3) MG/ML IJ SUSP: 6.0000 mg | Freq: Once | INTRAMUSCULAR | Status: DC

## 2024-09-29 MED ORDER — CHLORTHALIDONE 25 MG PO TABS: 25.0000 mg | ORAL_TABLET | Freq: Every day | ORAL | 3 refills | Status: AC

## 2024-09-29 NOTE — Progress Notes (Signed)
 Subjective:  Patient ID: Gregory Evans, male    DOB: 05-Jan-1956  Age: 68 y.o. MRN: [id number]  CC: Shoulder Pain (Left shoulder pain. Would like cortisone shot. It has been 6 months since last one. ) and Annual Exam   HPI  Discussed the use of AI scribe software for clinical note transcription with the patient, who gave verbal consent to proceed.  History of Present Illness Gregory Evans is a 68 year old male who presents for an annual physical exam.  He experiences shoulder pain but does not provide further details about this issue.  He has hypertension and is currently taking amlodipine , chlorthalidone , and metoprolol . He occasionally checks his blood pressure at home, with readings sometimes around 127/70s. He has experienced throat swelling in the past with stronger medications. He experiences some swelling in his ankles despite taking chlorthalidone  daily.  He has erectile dysfunction, which he attributes to metoprolol . He uses sildenafil , taking up to three tablets at a time, which he finds effective.  He has neuropathic pain characterized by a burning sensation in his legs. He was previously prescribed pregabalin , which was effective, but he has run out and the symptoms have returned, though less frequently. The burning sensation typically occurs in the morning after starting work and lasts about 30 seconds.  He takes Celebrex  for joint pain, which has alleviated his knee pain without causing stomach upset.  He reports improved breathing and weight loss of 13 pounds since May, attributing this to his efforts to lose weight. He occasionally feels winded when climbing stairs but is working towards a goal weight of 220 pounds. No chest pain, heart palpitations, or shortness of breath. He wakes up twice a night to urinate but feels rested in the morning unless he goes to bed late.  He last visited an eye doctor two years ago and plans to schedule an appointment soon. He has been informed of  potential cataracts but does not currently have issues with reading or seeing road signs.          09/22/2023    3:40 PM 09/22/2023    3:27 PM 02/19/2023    3:42 PM  Depression screen PHQ 2/9  Decreased Interest 0 0 0  Down, Depressed, Hopeless 0 0 0  PHQ - 2 Score 0 0 0  Altered sleeping 3    Tired, decreased energy 1    Change in appetite 0    Feeling bad or failure about yourself  0    Trouble concentrating 0    Moving slowly or fidgety/restless 0    Suicidal thoughts 0    PHQ-9 Score 4     Difficult doing work/chores Not difficult at all       Data saved with a previous flowsheet row definition    History Gregory Evans has a past medical history of Allergy, Colon polyp (2008), Erectile dysfunction, Hyperlipidemia, Hypertension, Osteoarthritis, and Vitamin D  deficiency.   He has a past surgical history that includes Cystectomy; Colonoscopy with propofol  (N/A, 12/26/2019); and Hernia repair.   His family history includes Dementia in his mother; Diabetes (age of onset: 2) in his father; Hypertension in his mother.He reports that he quit smoking about 31 years ago. His smoking use included cigarettes. He has never used smokeless tobacco. He reports current alcohol use. He reports that he does not use drugs.    ROS Review of Systems  Constitutional: Negative.  Negative for fever.  HENT: Negative.    Eyes:  Negative for visual disturbance.  Respiratory:  Negative for cough and shortness of breath.   Cardiovascular:  Negative for chest pain and leg swelling.  Gastrointestinal:  Negative for abdominal pain, diarrhea, nausea and vomiting.  Genitourinary:  Negative for difficulty urinating.  Musculoskeletal:  Negative for arthralgias and myalgias.  Skin:  Negative for rash.  Neurological:  Negative for headaches.  Psychiatric/Behavioral:  Negative for sleep disturbance.     Objective:  BP (!) 142/67   Pulse (!) 58   Temp 98 F (36.7 C)   Ht 5' 11.5 (1.816 m)   Wt 268 lb  (121.6 kg)   SpO2 100%   BMI 36.86 kg/m   BP Readings from Last 3 Encounters:  09/29/24 (!) 142/67  05/05/24 (!) 157/73  03/21/24 117/68    Wt Readings from Last 3 Encounters:  09/29/24 268 lb (121.6 kg)  03/21/24 281 lb (127.5 kg)  09/22/23 278 lb 12.8 oz (126.5 kg)     Physical Exam Vitals reviewed.  Constitutional:      Appearance: He is well-developed.  HENT:     Head: Normocephalic and atraumatic.     Right Ear: External ear normal.     Left Ear: External ear normal.     Mouth/Throat:     Pharynx: No oropharyngeal exudate or posterior oropharyngeal erythema.  Eyes:     Pupils: Pupils are equal, round, and reactive to light.  Cardiovascular:     Rate and Rhythm: Normal rate and regular rhythm.     Heart sounds: No murmur heard. Pulmonary:     Effort: No respiratory distress.     Breath sounds: Normal breath sounds.  Musculoskeletal:     Cervical back: Normal range of motion and neck supple.  Neurological:     Mental Status: He is alert and oriented to person, place, and time.    Physical Exam VITALS: BP- 182/67 MEASUREMENTS: Weight- 268. GENERAL: Alert, cooperative, well developed, no acute distress. HEENT: Normocephalic, normal oropharynx, moist mucous membranes. NECK: Carotid artery without bruit. CHEST: Clear to auscultation bilaterally, no wheezes, rhonchi, or crackles. CARDIOVASCULAR: Normal heart rate and rhythm, S1 and S2 normal with a slight murmur. ABDOMEN: Soft, non-tender, non-distended, without organomegaly, normal bowel sounds. EXTREMITIES: No cyanosis or edema. NEUROLOGICAL: Cranial nerves grossly intact, moves all extremities without gross motor or sensory deficit. SKIN: Normal.   Assessment & Plan:  Encounter for immunization -     Flu vaccine HIGH DOSE PF(Fluzone Trivalent)  Osteoarthritis, unspecified osteoarthritis type, unspecified site  Essential hypertension    Assessment and Plan Assessment & Plan Adult Wellness Visit    Routine adult wellness visit with no acute issues. He has lost 13 pounds since May. No chest pain, palpitations, or shortness of breath. No hearing or skin problems. Physical examination reveals no significant findings. Ordered blood work and scheduled a follow-up appointment in one month.  Essential hypertension, poorly controlled   Blood pressure is 182/67, indicating poor control, though home readings are generally good. Current medications include amlodipine , chlorthalidone , and metoprolol , which may contribute to erectile dysfunction. Previous adverse reactions to stronger antihypertensives limit options. Instructed to check blood pressure daily for two weeks and record readings. Follow-up appointment scheduled in one month to review readings.  Peripheral neuropathy, lower extremities   Burning sensation in legs has returned but is less frequent. Previously managed effectively with pregabalin . Prescribed pregabalin  100 mg at bedtime, with plans to increase to 200 mg if needed.  Other male erectile dysfunction   Erectile dysfunction possibly exacerbated by metoprolol . Sildenafil  has  been effective in the past. Renewed sildenafil  prescription with instructions to take 1-5 tablets as needed.  Benign prostatic hyperplasia with lower urinary tract symptoms   Experiences nocturia twice per night but feels rested in the morning. No significant issues reported.  Left shoulder pain   Reports shoulder pain. Administered cortisone injection in the left shoulder.  Unspecified osteoarthritis   Knee pain is well-controlled with celecoxib . No gastrointestinal side effects reported. Continue celecoxib  as prescribed.  Mixed hyperlipidemia   No specific discussion regarding hyperlipidemia management in this visit.       Follow-up: Return in about 1 month (around 10/29/2024) for Pain.  Butler Der, M.D.

## 2024-09-30 LAB — CMP14+EGFR
ALT: 48 IU/L — ABNORMAL HIGH (ref 0–44)
AST: 38 IU/L (ref 0–40)
Albumin: 4.5 g/dL (ref 3.9–4.9)
Alkaline Phosphatase: 45 IU/L — ABNORMAL LOW (ref 47–123)
BUN/Creatinine Ratio: 16 (ref 10–24)
BUN: 20 mg/dL (ref 8–27)
Bilirubin Total: 0.8 mg/dL (ref 0.0–1.2)
CO2: 25 mmol/L (ref 20–29)
Calcium: 9.7 mg/dL (ref 8.6–10.2)
Chloride: 96 mmol/L (ref 96–106)
Creatinine, Ser: 1.28 mg/dL — ABNORMAL HIGH (ref 0.76–1.27)
Globulin, Total: 2.8 g/dL (ref 1.5–4.5)
Glucose: 97 mg/dL (ref 70–99)
Potassium: 3.7 mmol/L (ref 3.5–5.2)
Sodium: 138 mmol/L (ref 134–144)
Total Protein: 7.3 g/dL (ref 6.0–8.5)
eGFR: 60 mL/min/1.73 (ref >59)

## 2024-09-30 LAB — CBC WITH DIFFERENTIAL/PLATELET
Basophils Absolute: 0.1 x10E3/uL (ref 0.0–0.2)
Basos: 1 %
EOS (ABSOLUTE): 0.3 x10E3/uL (ref 0.0–0.4)
Eos: 4 %
Hematocrit: 40.9 % (ref 37.5–51.0)
Hemoglobin: 13.6 g/dL (ref 13.0–17.7)
Immature Grans (Abs): 0 x10E3/uL (ref 0.0–0.1)
Immature Granulocytes: 0 %
Lymphocytes Absolute: 1.8 x10E3/uL (ref 0.7–3.1)
Lymphs: 27 %
MCH: 30.9 pg (ref 26.6–33.0)
MCHC: 33.3 g/dL (ref 31.5–35.7)
MCV: 93 fL (ref 79–97)
Monocytes Absolute: 0.8 x10E3/uL (ref 0.1–0.9)
Monocytes: 12 %
Neutrophils Absolute: 3.8 x10E3/uL (ref 1.4–7.0)
Neutrophils: 56 %
Platelets: 239 x10E3/uL (ref 150–450)
RBC: 4.4 x10E6/uL (ref 4.14–5.80)
RDW: 11.3 % — ABNORMAL LOW (ref 11.6–15.4)
WBC: 6.8 x10E3/uL (ref 3.4–10.8)

## 2024-09-30 LAB — LIPID PANEL
Chol/HDL Ratio: 3.3 ratio (ref 0.0–5.0)
Cholesterol, Total: 186 mg/dL (ref 100–199)
HDL: 57 mg/dL (ref >39)
LDL Chol Calc (NIH): 108 mg/dL — ABNORMAL HIGH (ref 0–99)
Triglycerides: 116 mg/dL (ref 0–149)
VLDL Cholesterol Cal: 21 mg/dL (ref 5–40)

## 2024-09-30 LAB — VITAMIN D 25 HYDROXY (VIT D DEFICIENCY, FRACTURES): Vit D, 25-Hydroxy: 54 ng/mL (ref 30.0–100.0)

## 2024-10-02 ENCOUNTER — Encounter: Payer: Self-pay | Admitting: Family Medicine

## 2024-10-02 ENCOUNTER — Ambulatory Visit: Payer: Self-pay | Admitting: Family Medicine

## 2024-10-02 NOTE — Progress Notes (Signed)
Hello  Chord,    Your lab result is normal and/or stable.Some minor variations that are not significant are commonly marked abnormal, but do not represent any medical problem for you.   Best regards,  Kristian Mogg, M.D.

## 2024-10-03 ENCOUNTER — Other Ambulatory Visit (HOSPITAL_COMMUNITY): Payer: Self-pay

## 2024-10-05 ENCOUNTER — Other Ambulatory Visit (HOSPITAL_COMMUNITY): Payer: Self-pay

## 2024-10-31 ENCOUNTER — Encounter: Payer: Self-pay | Admitting: Family Medicine

## 2024-10-31 ENCOUNTER — Ambulatory Visit: Admitting: Family Medicine

## 2024-10-31 VITALS — BP 117/70 | HR 72 | Temp 98.0°F | Ht 71.5 in | Wt 263.0 lb

## 2024-10-31 DIAGNOSIS — L2084 Intrinsic (allergic) eczema: Secondary | ICD-10-CM

## 2024-10-31 DIAGNOSIS — N451 Epididymitis: Secondary | ICD-10-CM

## 2024-10-31 DIAGNOSIS — Z791 Long term (current) use of non-steroidal anti-inflammatories (NSAID): Secondary | ICD-10-CM | POA: Diagnosis not present

## 2024-10-31 DIAGNOSIS — I10 Essential (primary) hypertension: Secondary | ICD-10-CM

## 2024-10-31 DIAGNOSIS — M1990 Unspecified osteoarthritis, unspecified site: Secondary | ICD-10-CM

## 2024-10-31 MED ORDER — CIPROFLOXACIN HCL 500 MG PO TABS: 500.0000 mg | ORAL_TABLET | Freq: Two times a day (BID) | ORAL | 0 refills | Status: AC

## 2024-10-31 MED ORDER — BETAMETHASONE SOD PHOS & ACET 6 (3-3) MG/ML IJ SUSP: 6.0000 mg | Freq: Once | INTRAMUSCULAR | Status: AC

## 2024-10-31 MED ORDER — CELECOXIB 200 MG PO CAPS: 200.0000 mg | ORAL_CAPSULE | Freq: Every day | ORAL | 0 refills | Status: AC

## 2024-10-31 NOTE — Progress Notes (Signed)
 Subjective:  Patient ID: Gregory Evans, male    DOB: Apr 10, 1956  Age: 68 y.o. MRN: [id number]  CC: itching (Neck and back for about 4 or 5 days. Taking benadryl. ) and genital swelling (Swollen since Thursday. Tender to the touch.  No discoloration. Itching started around the same time. Frequent urination. )   HPI  Discussed the use of AI scribe software for clinical note transcription with the patient, who gave verbal consent to proceed.  History of Present Illness Gregory Evans is a 68 year old male who presents with high blood pressure, shoulder and knee pain, itching on the neck and back, and swelling in the genitals.  He returns for follow-up due to previously high blood pressure readings. He reports that he has been watching his calorie intake, which he believes has contributed to his improved blood pressure. He reports ongoing weight loss over the past several months.  He experiences shoulder and knee pain that 'comes and goes'. He is currently taking Celebrex  100 mg once daily for pain management.  He has been experiencing itching on his neck and back for the past four to five days. He has been taking Benadryl, which has helped to some extent. He describes the rash as itchy.  He reports swelling in his genitals for the past four days, which he suspects might be due to a strain from lifting objects weighing 25-30 pounds. The swelling is painful when sitting or getting up.  He mentions occasional hiccups and a sensation of feeling better in his belly.          09/22/2023    3:40 PM 09/22/2023    3:27 PM 02/19/2023    3:42 PM  Depression screen PHQ 2/9  Decreased Interest 0 0 0  Down, Depressed, Hopeless 0 0 0  PHQ - 2 Score 0 0 0  Altered sleeping 3    Tired, decreased energy 1    Change in appetite 0    Feeling bad or failure about yourself  0    Trouble concentrating 0    Moving slowly or fidgety/restless 0    Suicidal thoughts 0    PHQ-9 Score 4     Difficult doing  work/chores Not difficult at all       Data saved with a previous flowsheet row definition    History Gregory Evans has a past medical history of Allergy, Colon polyp (2008), Erectile dysfunction, Hyperlipidemia, Hypertension, Osteoarthritis, and Vitamin D  deficiency.   He has a past surgical history that includes Cystectomy; Colonoscopy with propofol  (N/A, 12/26/2019); and Hernia repair.   His family history includes Dementia in his mother; Diabetes (age of onset: 34) in his father; Hypertension in his mother.He reports that he quit smoking about 31 years ago. His smoking use included cigarettes. He has never used smokeless tobacco. He reports current alcohol use. He reports that he does not use drugs.    ROS Review of Systems  Constitutional: Negative.   HENT: Negative.    Eyes:  Negative for visual disturbance.  Respiratory:  Negative for cough and shortness of breath.   Cardiovascular:  Negative for chest pain and leg swelling.  Gastrointestinal:  Negative for abdominal pain, diarrhea, nausea and vomiting.  Genitourinary:  Negative for difficulty urinating.  Musculoskeletal:  Positive for arthralgias. Negative for myalgias.  Skin:  Negative for rash.  Neurological:  Negative for headaches.  Psychiatric/Behavioral:  Negative for sleep disturbance.     Objective:  BP 117/70   Pulse 72  Temp 98 F (36.7 C)   Ht 5' 11.5 (1.816 m)   Wt 263 lb (119.3 kg)   SpO2 96%   BMI 36.17 kg/m   BP Readings from Last 3 Encounters:  10/31/24 117/70  09/29/24 (!) 142/67  05/05/24 (!) 157/73    Wt Readings from Last 3 Encounters:  10/31/24 263 lb (119.3 kg)  09/29/24 268 lb (121.6 kg)  03/21/24 281 lb (127.5 kg)     Physical Exam Physical Exam VITALS: BP- 117/70 GENERAL: Alert, cooperative, well developed, no acute distress. HEENT: Normocephalic, normal oropharynx, moist mucous membranes. CHEST: Clear to auscultation bilaterally, no wheezes, rhonchi, or crackles. CARDIOVASCULAR:  Normal heart rate and rhythm, S1 and S2 normal without murmurs. ABDOMEN: Soft, non-tender, non-distended, without organomegaly, normal bowel sounds. GENITOURINARY: Scrotal swelling noted, not a hernia, not in the testicle. EXTREMITIES: No cyanosis or edema. MUSCULOSKELETAL: Shoulder examined, no specific findings articulated. NEUROLOGICAL: Cranial nerves grossly intact, moves all extremities without gross motor or sensory deficit. SKIN: Skin examined, no specific findings articulated.   Assessment & Plan:  Epididymitis -     Ciprofloxacin  HCl; Take 1 tablet (500 mg total) by mouth 2 (two) times daily. Take all of these.  Dispense: 30 tablet; Refill: 0  Osteoarthritis, unspecified osteoarthritis type, unspecified site -     Betamethasone  Sod Phos & Acet -     CMP14+EGFR; Future -     Celecoxib ; Take 1 capsule (200 mg total) by mouth daily. For arthritis pain. Take with food  Dispense: 90 capsule; Refill: 0  Primary hypertension -     CMP14+EGFR; Future  NSAID long-term use -     CMP14+EGFR; Future  Intrinsic eczema -     Betamethasone  Sod Phos & Acet    Assessment and Plan Assessment & Plan Epididymitis   He has experienced swelling in the genital area for four days, with pain when sitting. Prescribed antibiotics for a few days. If swelling does not decrease, an ultrasound will be ordered to rule out serious conditions.  Osteoarthritis   He reports intermittent shoulder pain, possibly related to osteoarthritis, and is currently taking Celebrex  100 mg daily. Discussed the risks of combining Celebrex  with ibuprofen , including potential for ulcers and kidney damage. He should continue Celebrex  100 mg daily and avoid taking ibuprofen  within 24 hours of Celebrex . If pain persists, an increase in the Celebrex  dose will be considered. A lab appointment will be scheduled to monitor kidney function after increasing the Celebrex  dose.  Primary hypertension   His blood pressure has improved  to 117/70 mmHg, with weight loss and calorie monitoring contributing to the improvement. He should continue current lifestyle modifications for weight management.  Pruritus   He has experienced itching on the neck and back for four to five days, with a rash partially relieved by Benadryl. Discussed treatment options including topical cortisone cream and cortisone injection. He prefers the injection for convenience and additional relief from shoulder pain. Administered cortisone injection and advised applying ice packs to affected areas for relief.       Follow-up: No follow-ups on file.  Butler Der, M.D.

## 2024-11-07 ENCOUNTER — Telehealth: Payer: Self-pay | Admitting: Pharmacist

## 2024-11-07 NOTE — Telephone Encounter (Signed)
 BP now at goal  BP currently is 117/70 On amlodipine  & metoprolol  ACEi/ARB allergy  Mliss Tarry Griffin, PharmD, BCACP, CPP Clinical Pharmacist, [facility]

## 2024-12-01 ENCOUNTER — Encounter (INDEPENDENT_AMBULATORY_CARE_PROVIDER_SITE_OTHER): Payer: Self-pay | Admitting: *Deleted

## 2024-12-17 ENCOUNTER — Other Ambulatory Visit: Payer: Self-pay | Admitting: Family Medicine

## 2024-12-17 DIAGNOSIS — Z791 Long term (current) use of non-steroidal anti-inflammatories (NSAID): Secondary | ICD-10-CM

## 2024-12-18 ENCOUNTER — Other Ambulatory Visit: Payer: Self-pay | Admitting: Family Medicine

## 2024-12-18 DIAGNOSIS — I10 Essential (primary) hypertension: Secondary | ICD-10-CM
